# Patient Record
Sex: Male | Born: 1970 | Race: White | Hispanic: No | Smoking: Current every day smoker
Health system: Southern US, Community
[De-identification: ages and names within clinical notes are randomized; demographics above are authoritative.]

## PROBLEM LIST (undated history)

## (undated) DIAGNOSIS — I10 Essential (primary) hypertension: Secondary | ICD-10-CM

## (undated) HISTORY — PX: APPENDECTOMY: SHX54

## (undated) HISTORY — PX: KIDNEY SURGERY: SHX687

---

## 1999-05-10 ENCOUNTER — Emergency Department (HOSPITAL_COMMUNITY): Admission: EM | Admit: 1999-05-10 | Discharge: 1999-05-10 | Payer: Self-pay | Admitting: Emergency Medicine

## 1999-05-10 ENCOUNTER — Encounter: Payer: Self-pay | Admitting: Emergency Medicine

## 1999-09-08 ENCOUNTER — Emergency Department (HOSPITAL_COMMUNITY): Admission: EM | Admit: 1999-09-08 | Discharge: 1999-09-08 | Payer: Self-pay | Admitting: Emergency Medicine

## 2000-04-28 ENCOUNTER — Emergency Department (HOSPITAL_COMMUNITY): Admission: EM | Admit: 2000-04-28 | Discharge: 2000-04-28 | Payer: Self-pay | Admitting: Emergency Medicine

## 2000-04-28 ENCOUNTER — Encounter: Payer: Self-pay | Admitting: Emergency Medicine

## 2000-09-22 ENCOUNTER — Emergency Department (HOSPITAL_COMMUNITY): Admission: EM | Admit: 2000-09-22 | Discharge: 2000-09-22 | Payer: Self-pay | Admitting: Emergency Medicine

## 2000-12-15 ENCOUNTER — Emergency Department (HOSPITAL_COMMUNITY): Admission: EM | Admit: 2000-12-15 | Discharge: 2000-12-15 | Payer: Self-pay | Admitting: Emergency Medicine

## 2001-03-28 ENCOUNTER — Ambulatory Visit (HOSPITAL_COMMUNITY): Admission: RE | Admit: 2001-03-28 | Discharge: 2001-03-28 | Payer: Self-pay | Admitting: Family Medicine

## 2001-03-28 ENCOUNTER — Encounter: Payer: Self-pay | Admitting: Family Medicine

## 2001-11-30 ENCOUNTER — Emergency Department (HOSPITAL_COMMUNITY): Admission: EM | Admit: 2001-11-30 | Discharge: 2001-11-30 | Payer: Self-pay | Admitting: Emergency Medicine

## 2001-11-30 ENCOUNTER — Encounter: Payer: Self-pay | Admitting: Emergency Medicine

## 2001-12-08 ENCOUNTER — Encounter: Payer: Self-pay | Admitting: Orthopedic Surgery

## 2001-12-08 ENCOUNTER — Ambulatory Visit (HOSPITAL_COMMUNITY): Admission: RE | Admit: 2001-12-08 | Discharge: 2001-12-08 | Payer: Self-pay | Admitting: Orthopedic Surgery

## 2002-11-17 ENCOUNTER — Emergency Department (HOSPITAL_COMMUNITY): Admission: EM | Admit: 2002-11-17 | Discharge: 2002-11-17 | Payer: Self-pay | Admitting: Emergency Medicine

## 2002-11-17 ENCOUNTER — Encounter: Payer: Self-pay | Admitting: Emergency Medicine

## 2004-03-04 ENCOUNTER — Inpatient Hospital Stay (HOSPITAL_COMMUNITY): Admission: EM | Admit: 2004-03-04 | Discharge: 2004-03-05 | Payer: Self-pay | Admitting: Emergency Medicine

## 2004-10-05 ENCOUNTER — Emergency Department: Payer: Self-pay | Admitting: Emergency Medicine

## 2005-03-10 ENCOUNTER — Emergency Department (HOSPITAL_COMMUNITY): Admission: EM | Admit: 2005-03-10 | Discharge: 2005-03-10 | Payer: Self-pay | Admitting: Emergency Medicine

## 2005-06-24 ENCOUNTER — Emergency Department (HOSPITAL_COMMUNITY): Admission: EM | Admit: 2005-06-24 | Discharge: 2005-06-25 | Payer: Self-pay | Admitting: Emergency Medicine

## 2006-05-08 ENCOUNTER — Ambulatory Visit: Payer: Self-pay | Admitting: Infectious Diseases

## 2006-09-04 ENCOUNTER — Emergency Department (HOSPITAL_COMMUNITY): Admission: EM | Admit: 2006-09-04 | Discharge: 2006-09-04 | Payer: Self-pay | Admitting: Emergency Medicine

## 2007-06-27 ENCOUNTER — Emergency Department (HOSPITAL_COMMUNITY): Admission: EM | Admit: 2007-06-27 | Discharge: 2007-06-27 | Payer: Self-pay | Admitting: Emergency Medicine

## 2007-07-28 ENCOUNTER — Emergency Department (HOSPITAL_COMMUNITY): Admission: EM | Admit: 2007-07-28 | Discharge: 2007-07-28 | Payer: Self-pay | Admitting: Emergency Medicine

## 2008-01-26 ENCOUNTER — Emergency Department (HOSPITAL_COMMUNITY): Admission: EM | Admit: 2008-01-26 | Discharge: 2008-01-26 | Payer: Self-pay | Admitting: Emergency Medicine

## 2008-03-04 ENCOUNTER — Emergency Department (HOSPITAL_COMMUNITY): Admission: EM | Admit: 2008-03-04 | Discharge: 2008-03-04 | Payer: Self-pay | Admitting: Emergency Medicine

## 2008-06-07 ENCOUNTER — Emergency Department (HOSPITAL_COMMUNITY): Admission: EM | Admit: 2008-06-07 | Discharge: 2008-06-07 | Payer: Self-pay | Admitting: Emergency Medicine

## 2011-04-13 NOTE — H&P (Signed)
Alex Harris, Alex Harris                           ACCOUNT NO.:  [id number]   MEDICAL RECORD NO.:  [id number]                   PATIENT TYPE:  INP   LOCATION:  1843                                 FACILITY:  [facility]   PHYSICIAN:  Gabrielle Dare. Janee Morn, M.D.             DATE OF BIRTH:  Nov 22, 1971   DATE OF ADMISSION:  03/03/2004  DATE OF DISCHARGE:                                HISTORY & PHYSICAL   REASON FOR ADMISSION:  Left renal laceration and right knee sprain, status  post fall.   HISTORY OF PRESENT ILLNESS:  The patient is a 40 year old white male who was  walking on a gravel road when he slipped and fell in some mud.  He twisted  his right knee and fell.  He was a nontrauma code activation and came to the  emergency room complaining of some right knee pain, he also had some pain in  this left flank.  A CT scan was done which revealed a left renal hematoma  with some active extravasation.  X-ray of his right knee was negative for a  fracture.  He was initially evaluated by Dr. Lindie Spruce from the trauma service  and arranged for angiographic embolization for his kidney laceration and he  is going to be admitted to our service afterwards.   PAST MEDICAL HISTORY:  Negative.   PAST SURGICAL HISTORY:  Right knee surgery, suspected arthroscopic.   SOCIAL HISTORY:  He is married, does not drink or smoke.   MEDICATIONS:  None.   ALLERGIES:  No known drug allergies.   REVIEW OF SYSTEMS:  CONSTITUTIONAL: Negative.  EYES/EARS/NOSE AND THROAT:  Negative.  CARDIOVASCULAR: Negative.  GU: Negative.  GI: Negative.  PULMONARY: Negative.  SKIN/MUSCULOSKELETAL: See the history of present  illness.  NEURO/PSYCH: Negative.   PHYSICAL EXAMINATION:  VITAL SIGNS: Pulse 83, blood pressure 133/85,  respirations 18, temperature 98.1.  SKIN: Warm.  HEENT: Normocephalic, atraumatic.  Eye exam: Extraocular muscles are intact,  pupils are 2 mm and equal and reactive bilaterally.  Ears: Clear externally.  NECK:  No tenderness with no distended neck veins.  Trachea is in the  midline.  CHEST: No crepitance.  LUNGS: Clear to auscultation bilaterally.  HEART: Regular rate and rhythm.  PMI is palpable in the left chest.  ABDOMEN: Soft and nontender with some hypoactive bowel sounds.  BACK: Nontender along the midline.  He does have some left flank tenderness  to palpation.  PELVIS: Stable anteriorly.  EXTREMITIES: He had some swelling around his right knee with tenderness.  There was a Band-Aid on his right groin from his angio.  NEURO: Alert and oriented.  Glasgow coma scale 15.  Cranial nerves II-XII  were grossly intact.  Sensation and motor exam is intact bilaterally.  Upper  and lower extremity vascular exam was intact.   LABORATORY STUDIES:  Hemoglobin was 14.2, platelets 167, INR 1, PT 13.4,  sodium 138, potassium 4.1,  chloride 107, BUN 14, glucose 129.  X-rays, leg  film of his right knee showed nothing acute.  A CT scan of the abdomen and  pelvis shows a renal laceration on the left with active extravasation and a  hematoma.   IMPRESSION/PLAN:  34. A 40 year old white male who is status post fall and right knee sprain.  2. Left renal laceration with extravasation.   PLAN:  1. The patient underwent left renal angiogram with embolization.  2. Hemostasis, we plan to admit him for pain control.  I will also get a     followup CBC later this afternoon and keep a close eye on his hemoglobin     level.   Plan was discussed in detail with the patient and his wife and questions  were answered.                                                Gabrielle Dare Janee Morn, M.D.    BET/MEDQ  D:  03/04/2004  T:  03/05/2004  Job:  [id number]

## 2011-04-13 NOTE — Discharge Summary (Signed)
Alex Harris, Alex Harris                           ACCOUNT NO.:  [id number]   MEDICAL RECORD NO.:  [id number]                   PATIENT TYPE:  INP   LOCATION:  5711                                 FACILITY:  [facility]   PHYSICIAN:  Jimmye Norman, M.D.                   DATE OF BIRTH:  Jul 28, 1971   DATE OF ADMISSION:  03/03/2004  DATE OF DISCHARGE:  03/05/2004                                 DISCHARGE SUMMARY   FINAL DIAGNOSES:  1. Fall.  2. Right knee sprain.  3. Left renal laceration.   PROCEDURE:  Embolization of left renal bleeding artery performed by  radiology.   HISTORY:  This is a 40 year old white male who was walking down a graveled  road when he slipped in some mud, straining his right knee. He came to the  emergency room via POV. Workup revealed a left renal hematoma with active  extravasation. Because of this, he was taken over to radiology per special  procedures and had an embolization of this bleeding part of the artery. The  patient tolerated this procedure well.   HOSPITAL COURSE:  The patient was subsequently admitted to the hospital.  Repeat blood work revealed a stable hemoglobin and hematocrit _____________  stable. Initial hemoglobin was 14.2 with a hematocrit of 40.5. On March 04, 2004, it drifted down to 13.8 and 39.1 hematocrit, then on March 05, 2004  was 13.6 and 38.4 hematocrit. The patient was doing well at this time,  having no complaints, up and ambulating without difficulty. At this point,  he was ready to go home. The patient was given Percocet one to two p.o. q.4-  6h. p.r.n. for pain. He needs to follow up with the trauma service on April  12. He is subsequently discharged at this time in satisfactory and stable  condition.      [name], P.A.                      Jimmye Norman, M.D.    CL/MEDQ  D:  03/05/2004  T:  03/06/2004  Job:  [id number]

## 2011-08-20 LAB — I-STAT 8, (EC8 V) (CONVERTED LAB)
Acid-base deficit: 1
Hemoglobin: 15.6
Potassium: 3.7
Sodium: 140
TCO2: 25

## 2011-08-20 LAB — DIFFERENTIAL
Eosinophils Relative: 1
Lymphocytes Relative: 21
Lymphs Abs: 2.1
Neutrophils Relative %: 71

## 2011-08-20 LAB — CBC
Hemoglobin: 15.6
Platelets: 217

## 2011-08-20 LAB — POCT I-STAT CREATININE
Creatinine, Ser: 1
Operator id: 257131

## 2011-08-21 LAB — DIFFERENTIAL
Basophils Absolute: 0.1
Basophils Relative: 1
Eosinophils Absolute: 0.1
Eosinophils Relative: 1
Lymphocytes Relative: 18
Lymphs Abs: 2.2
Monocytes Absolute: 0.6
Monocytes Relative: 5
Neutro Abs: 9.2 — ABNORMAL HIGH
Neutrophils Relative %: 75

## 2011-08-21 LAB — CBC
HCT: 42.5
Hemoglobin: 15.1
MCHC: 35.5
MCV: 92.9
Platelets: 161
RBC: 4.57
RDW: 13.6
WBC: 12.1 — ABNORMAL HIGH

## 2011-08-21 LAB — POCT I-STAT, CHEM 8
BUN: 14
Calcium, Ion: 1.17
Chloride: 108
Creatinine, Ser: 1
Glucose, Bld: 98
HCT: 43
Hemoglobin: 14.6
Potassium: 4
Sodium: 140
TCO2: 24

## 2011-09-10 LAB — BASIC METABOLIC PANEL
CO2: 26
Calcium: 8.8
GFR calc Af Amer: >60
Sodium: 140

## 2012-11-05 ENCOUNTER — Emergency Department: Payer: Self-pay | Admitting: Internal Medicine

## 2012-12-08 ENCOUNTER — Emergency Department: Payer: Self-pay | Admitting: Emergency Medicine

## 2012-12-08 LAB — URINALYSIS, COMPLETE
Glucose,UR: NEGATIVE mg/dL (ref 0–75)
Nitrite: NEGATIVE
Protein: NEGATIVE
WBC UR: 4 /HPF (ref 0–5)

## 2012-12-08 LAB — COMPREHENSIVE METABOLIC PANEL
Albumin: 3.6 g/dL (ref 3.4–5.0)
Anion Gap: 7 (ref 7–16)
BUN: 9 mg/dL (ref 7–18)
Bilirubin,Total: 0.8 mg/dL (ref 0.2–1.0)
Chloride: 106 mmol/L (ref 98–107)
Co2: 26 mmol/L (ref 21–32)
Osmolality: 276 (ref 275–301)
Total Protein: 7.9 g/dL (ref 6.4–8.2)

## 2012-12-08 LAB — LIPASE, BLOOD: Lipase: 206 U/L (ref 73–393)

## 2012-12-08 LAB — CBC
HCT: 45.1 % (ref 40.0–52.0)
MCHC: 34.7 g/dL (ref 32.0–36.0)
MCV: 93 fL (ref 80–100)
Platelet: 162 10*3/uL (ref 150–440)

## 2012-12-08 LAB — PROTIME-INR: INR: 1

## 2013-02-10 ENCOUNTER — Emergency Department (HOSPITAL_COMMUNITY)
Admission: EM | Admit: 2013-02-10 | Discharge: 2013-02-11 | Discharge status: Against Medical Advice | Payer: Self-pay | Attending: Emergency Medicine | Admitting: Emergency Medicine

## 2013-02-10 ENCOUNTER — Encounter (HOSPITAL_COMMUNITY): Payer: Self-pay | Admitting: *Deleted

## 2013-02-10 DIAGNOSIS — F1210 Cannabis abuse, uncomplicated: Secondary | ICD-10-CM | POA: Insufficient documentation

## 2013-02-10 DIAGNOSIS — Z5900 Homelessness unspecified: Secondary | ICD-10-CM | POA: Insufficient documentation

## 2013-02-10 DIAGNOSIS — F419 Anxiety disorder, unspecified: Secondary | ICD-10-CM

## 2013-02-10 DIAGNOSIS — F329 Major depressive disorder, single episode, unspecified: Secondary | ICD-10-CM

## 2013-02-10 DIAGNOSIS — I10 Essential (primary) hypertension: Secondary | ICD-10-CM | POA: Insufficient documentation

## 2013-02-10 DIAGNOSIS — F411 Generalized anxiety disorder: Secondary | ICD-10-CM | POA: Insufficient documentation

## 2013-02-10 DIAGNOSIS — F3289 Other specified depressive episodes: Secondary | ICD-10-CM | POA: Insufficient documentation

## 2013-02-10 DIAGNOSIS — F17200 Nicotine dependence, unspecified, uncomplicated: Secondary | ICD-10-CM | POA: Insufficient documentation

## 2013-02-10 HISTORY — DX: Essential (primary) hypertension: I10

## 2013-02-10 MED ORDER — ALUM & MAG HYDROXIDE-SIMETH 200-200-20 MG/5ML PO SUSP: 30.0000 mL | ORAL | Status: DC | PRN

## 2013-02-10 MED ORDER — ZOLPIDEM TARTRATE 5 MG PO TABS: 5.0000 mg | ORAL_TABLET | Freq: Every evening | ORAL | Status: DC | PRN

## 2013-02-10 MED ORDER — IBUPROFEN 200 MG PO TABS: 600.0000 mg | ORAL_TABLET | Freq: Three times a day (TID) | ORAL | Status: DC | PRN

## 2013-02-10 MED ORDER — ACETAMINOPHEN 325 MG PO TABS: 650.0000 mg | ORAL_TABLET | ORAL | Status: DC | PRN

## 2013-02-10 MED ORDER — ONDANSETRON HCL 8 MG PO TABS: 4.0000 mg | ORAL_TABLET | Freq: Three times a day (TID) | ORAL | Status: DC | PRN

## 2013-02-10 MED ORDER — LORAZEPAM 1 MG PO TABS: 1.0000 mg | ORAL_TABLET | Freq: Once | ORAL | Status: DC

## 2013-02-10 MED ORDER — NICOTINE 21 MG/24HR TD PT24: 21.0000 mg | MEDICATED_PATCH | Freq: Every day | TRANSDERMAL | Status: DC

## 2013-02-10 NOTE — ED Notes (Signed)
Charge nurse at bedside talking with pt

## 2013-02-10 NOTE — ED Notes (Signed)
Pt is wanting to go outside and smoke a cigarette. When told he could not leave the facility to smoke pt became agitated and states he is going to leave. When offered a nicotine patch, pt refuses and states he is going to go smoke or he is leaving. Pt states that "I have been lied to since I've been here." When asked what pt means he says "that girl at registration said I could go out and smoke." Pt is resistant to further treatment until he can smoke a cigarette. Tiffany PA is aware of situation.

## 2013-02-10 NOTE — ED Notes (Signed)
ACT team still at bedside with pt.

## 2013-02-10 NOTE — ED Notes (Signed)
Pt is expressing hopelessness. Pt states he doesn't have a plan to hurt himself, but has had thoughts about it. Pt states "I am living in a barn on my land with no power/water/heat because me and my wife of 13 years separated because she is messing around with an 44 year old boy that I helped raise. I quit my job because that son of a bitch works with me and I didn't think it was healthy to see him every day. I don't have a car to get around in, and I am just overwhelmed and I don't know what to do anymore. I've been leaning on my 107 year old son from my first marriage, and hes the one who should be leaning on me. It ain't right. I feel lower than dirt right now." pt is tearful when nurse was assessing pt. Pt states he has had problems with alcohol in the past, and stopped drinking for a while, but today drank "a lot" of alcohol because he thought it would help him. Pt also states he has smoked marijuana recently because he was trying to get rid of his emotional pain, because he "doesn't want to feel anymore." When nurse asked if he had a plan to harm himself, pt states, "I don't want to slit my wrist or anything like that, I just would be okay if I just laid here on this bed and never woke up."

## 2013-02-10 NOTE — ED Notes (Signed)
PA Tiffany at bedside.  

## 2013-02-10 NOTE — ED Notes (Signed)
Pt very tearful in triage. Pt was brought by sheriff. Pt states that he feels like he is depressed. Pt states that he is overwhelmed. Pt has family issues, work issues and living issues. Pt states that he is too overwhelmed to say if he is SI or HI. Pt states that he is suppose to be stronger than this. Pt needs resources for his depression.

## 2013-02-10 NOTE — ED Notes (Signed)
Pt given warm blanket. Updated on wait.

## 2013-02-10 NOTE — ED Notes (Signed)
Pt states he has not eaten anything since 8am on march 17th. Pt given sandwich and ice for drink.

## 2013-02-10 NOTE — ED Provider Notes (Signed)
History    This chart was scribed for non-physician practitioner working with Juliet Rude. Rubin Payor, MD by Donne Anon, ED Scribe. This patient was seen in room [facility] and the patient's care was started at 2143.   CSN: [id number]  Arrival date & time 02/10/13  2005   First MD Initiated Contact with Patient 02/10/13 2143      Chief Complaint  Patient presents with  . Depression     The history is provided by the patient. No language interpreter was used.   [place] Fassnacht is a 42 y.o. male who presents to the Emergency Department complaining of anxiety which causes him associated trembling. He discontinued his blood pressure medication 5 years ago because he cannot afford it. He denies any pain or recent injury. He is a recovered alcoholic and denies a history of seizures from alcohol withdrawel.   He came to the ED today because he is currently homeless and his wife left him for his 57 year old son from his first marriage. He is hopeless, depressed, and living in a barn right now without power, heat or water. He drank a large amount of alcohol and smoke a large amount of marijuana today because he though it would help. He does not want to hurt himself but he states he would hurt the 44 year old if he got the chance.   Past Medical History  Diagnosis Date  . Hypertension     Past Surgical History  Procedure Laterality Date  . Appendectomy    . 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 No pertinent family history.  History  Substance Use Topics  . Smoking status: Current Every Day Smoker  . Smokeless tobacco: Not on file  . Alcohol Use: Yes      Review of Systems  Psychiatric/Behavioral: Negative for suicidal ideas. The patient is nervous/anxious.   All other systems reviewed and are negative.    Allergies  Review of patient's allergies indicates no known allergies.  Home Medications  No current outpatient prescriptions on  file.  BP 117/97  Pulse 99  Temp(Src) 98.4 F (36.9 C) (Oral)  Resp 16  SpO2 99%  Physical Exam  Nursing note and vitals reviewed. Constitutional: He is oriented to person, place, and time. He appears well-developed and well-nourished. No distress.  HENT:  Head: Normocephalic and atraumatic.  Eyes: EOM are normal.  Neck: Neck supple. No tracheal deviation present.  Cardiovascular: Normal rate.   Pulmonary/Chest: Effort normal. No respiratory distress.  Musculoskeletal: Normal range of motion.  Neurological: He is alert and oriented to person, place, and time.  Skin: Skin is warm and dry.  Psychiatric: His behavior is normal. His mood appears anxious. His affect is angry. He is not actively hallucinating. Thought content is not paranoid. He exhibits a depressed mood. He expresses homicidal ideation. He expresses no suicidal ideation. He expresses no suicidal plans and no homicidal plans.  Pt shaking because he is very anxious.    ED Course  Procedures (including critical care time) DIAGNOSTIC STUDIES: Oxygen Saturation is 99% on room air, normal by my interpretation.    COORDINATION OF CARE: 10:48 PM Discussed treatment plan which includes consult with ACT team with pt at bedside and pt agreed to plan.   10:49 PM Marcus from ACT team has gone in to see pt.    Labs Reviewed  CBC WITH DIFFERENTIAL  BASIC METABOLIC PANEL  ETHANOL  URINE RAPID  DRUG SCREEN (HOSP PERFORMED)   No results found.   1. Depression   2. Homelessness   3. Anxiety       MDM   Patient was seen by ACT and Berna Spare has recommended he stay inpatient but does not meet criteria for needing to be IVC'd. The patient after being seen wanted a cigarette and when told he can not have one but was more than welcome to have an Ativan or a Nicotine patch, he refused and became irritable and rude. HE said he wants his damn cigarettes or he is walking out. Patient refuses blood work and walked out after being  talked to by Press photographer. Berna Spare from ACT said patient has no clear plan.   Pt left AMA       Dorthula Matas, PA-C 02/10/13 2347

## 2013-02-10 NOTE — ED Notes (Signed)
Berna Spare from [facility] team will see pt shortly.

## 2013-02-10 NOTE — ED Notes (Addendum)
Spoke with pt. Pt very aggitated and not willing to listen to alternatives to smoking. Pt offered a nicotine patch. Pt states that he is going to get a ride and leave. Pt informed that if he left he would be leaving AMA. Pt aware and left. PA aware. Pt still denies SI and HI.

## 2013-02-10 NOTE — ED Notes (Signed)
Pt is still resisting treatment after speaking with charge nurse. Pt talked over charge nurse during conversation and would not listen to alternative options to smoking a cigarette. Pt left AMA.

## 2013-02-10 NOTE — ED Notes (Signed)
ACT team at bedside for consult.

## 2013-02-11 ENCOUNTER — Encounter (HOSPITAL_COMMUNITY): Payer: Self-pay

## 2013-02-11 ENCOUNTER — Inpatient Hospital Stay (HOSPITAL_COMMUNITY)
Admission: RE | Admit: 2013-02-11 | Discharge: 2013-02-13 | DRG: 885 | Disposition: A | Discharge status: Home / Self Care | Payer: Federal, State, Local not specified - Other | Source: Ambulatory Visit | Attending: Psychiatry | Admitting: Psychiatry

## 2013-02-11 ENCOUNTER — Emergency Department (HOSPITAL_COMMUNITY)
Admission: EM | Admit: 2013-02-11 | Discharge: 2013-02-11 | Disposition: A | Discharge status: Other Acute Inpatient Hospital | Payer: Self-pay | Attending: Emergency Medicine | Admitting: Emergency Medicine

## 2013-02-11 DIAGNOSIS — F411 Generalized anxiety disorder: Secondary | ICD-10-CM | POA: Insufficient documentation

## 2013-02-11 DIAGNOSIS — F3289 Other specified depressive episodes: Secondary | ICD-10-CM | POA: Insufficient documentation

## 2013-02-11 DIAGNOSIS — F1210 Cannabis abuse, uncomplicated: Secondary | ICD-10-CM | POA: Insufficient documentation

## 2013-02-11 DIAGNOSIS — I10 Essential (primary) hypertension: Secondary | ICD-10-CM | POA: Diagnosis present

## 2013-02-11 DIAGNOSIS — R454 Irritability and anger: Secondary | ICD-10-CM

## 2013-02-11 DIAGNOSIS — Z5900 Homelessness unspecified: Secondary | ICD-10-CM | POA: Insufficient documentation

## 2013-02-11 DIAGNOSIS — F329 Major depressive disorder, single episode, unspecified: Principal | ICD-10-CM | POA: Diagnosis present

## 2013-02-11 DIAGNOSIS — F17200 Nicotine dependence, unspecified, uncomplicated: Secondary | ICD-10-CM | POA: Insufficient documentation

## 2013-02-11 DIAGNOSIS — F911 Conduct disorder, childhood-onset type: Secondary | ICD-10-CM | POA: Insufficient documentation

## 2013-02-11 DIAGNOSIS — Z79899 Other long term (current) drug therapy: Secondary | ICD-10-CM

## 2013-02-11 DIAGNOSIS — Z634 Disappearance and death of family member: Secondary | ICD-10-CM

## 2013-02-11 DIAGNOSIS — F419 Anxiety disorder, unspecified: Secondary | ICD-10-CM

## 2013-02-11 LAB — BASIC METABOLIC PANEL
Calcium: 9.1 mg/dL (ref 8.4–10.5)
Creatinine, Ser: 0.99 mg/dL (ref 0.50–1.35)
GFR calc Af Amer: >90 mL/min (ref >90)
GFR calc non Af Amer: >90 mL/min (ref >90)
Sodium: 139 mEq/L (ref 135–145)

## 2013-02-11 LAB — CBC WITH DIFFERENTIAL/PLATELET
Basophils Absolute: 0 10*3/uL (ref 0.0–0.1)
Basophils Relative: 0 % (ref 0–1)
Eosinophils Relative: 1 % (ref 0–5)
HCT: 45.2 % (ref 39.0–52.0)
MCHC: 35.4 g/dL (ref 30.0–36.0)
MCV: 92.4 fL (ref 78.0–100.0)
Monocytes Absolute: 0.6 10*3/uL (ref 0.1–1.0)
Platelets: 174 10*3/uL (ref 150–400)
RDW: 13.1 % (ref 11.5–15.5)
WBC: 10.4 10*3/uL (ref 4.0–10.5)

## 2013-02-11 LAB — RAPID URINE DRUG SCREEN, HOSP PERFORMED: Opiates: NOT DETECTED

## 2013-02-11 LAB — ETHANOL: Alcohol, Ethyl (B): <11 mg/dL (ref 0–11)

## 2013-02-11 MED ORDER — LORAZEPAM 1 MG PO TABS
1.0000 mg | ORAL_TABLET | Freq: Once | ORAL | Status: AC
  Administered 2013-02-11: 1 mg via ORAL
  Filled 2013-02-11: qty 1

## 2013-02-11 MED ORDER — NICOTINE 21 MG/24HR TD PT24
21.0000 mg | MEDICATED_PATCH | Freq: Every day | TRANSDERMAL | Status: DC
  Administered 2013-02-11 (×2): 21 mg via TRANSDERMAL
  Filled 2013-02-11 (×3): qty 1

## 2013-02-11 MED ORDER — ACETAMINOPHEN 325 MG PO TABS: 650.0000 mg | ORAL_TABLET | ORAL | Status: DC | PRN

## 2013-02-11 MED ORDER — ONDANSETRON HCL 8 MG PO TABS
4.0000 mg | ORAL_TABLET | Freq: Three times a day (TID) | ORAL | Status: DC | PRN
  Filled 2013-02-11: qty 1

## 2013-02-11 MED ORDER — TRAZODONE HCL 50 MG PO TABS
50.0000 mg | ORAL_TABLET | Freq: Every evening | ORAL | Status: DC | PRN
  Administered 2013-02-11: 50 mg via ORAL
  Filled 2013-02-11: qty 1
  Filled 2013-02-11: qty 14

## 2013-02-11 MED ORDER — ALUM & MAG HYDROXIDE-SIMETH 200-200-20 MG/5ML PO SUSP
30.0000 mL | ORAL | Status: DC | PRN
  Filled 2013-02-11: qty 30

## 2013-02-11 MED ORDER — LORAZEPAM 2 MG/ML IJ SOLN
1.0000 mg | Freq: Once | INTRAMUSCULAR | Status: AC
  Administered 2013-02-11: 1 mg via INTRAMUSCULAR
  Filled 2013-02-11: qty 1

## 2013-02-11 MED ORDER — ZOLPIDEM TARTRATE 5 MG PO TABS
5.0000 mg | ORAL_TABLET | Freq: Every evening | ORAL | Status: DC | PRN
  Administered 2013-02-11: 5 mg via ORAL
  Filled 2013-02-11 (×2): qty 1

## 2013-02-11 MED ORDER — IBUPROFEN 400 MG PO TABS: 600.0000 mg | ORAL_TABLET | Freq: Three times a day (TID) | ORAL | Status: DC | PRN

## 2013-02-11 MED ORDER — ACETAMINOPHEN 325 MG PO TABS: 650.0000 mg | ORAL_TABLET | Freq: Four times a day (QID) | ORAL | Status: DC | PRN

## 2013-02-11 MED ORDER — NICOTINE 21 MG/24HR TD PT24
21.0000 mg | MEDICATED_PATCH | Freq: Every day | TRANSDERMAL | Status: DC
  Administered 2013-02-12 – 2013-02-13 (×2): 21 mg via TRANSDERMAL
  Filled 2013-02-11 (×3): qty 1

## 2013-02-11 MED ORDER — MAGNESIUM HYDROXIDE 400 MG/5ML PO SUSP: 30.0000 mL | Freq: Every day | ORAL | Status: DC | PRN

## 2013-02-11 MED ORDER — ALUM & MAG HYDROXIDE-SIMETH 200-200-20 MG/5ML PO SUSP: 30.0000 mL | ORAL | Status: DC | PRN

## 2013-02-11 MED ORDER — LORAZEPAM 2 MG/ML IJ SOLN: 2.0000 mg | Freq: Once | INTRAMUSCULAR | Status: DC

## 2013-02-11 NOTE — ED Notes (Signed)
Pt very anxious .Pt not willing to change into blue scrubs right now but will change after he feels less anxious.Hospital policy explained to the pt.

## 2013-02-11 NOTE — Tx Team (Signed)
Initial Interdisciplinary Treatment Plan  PATIENT STRENGTHS: (choose at least two) Ability for insight Communication skills General fund of knowledge Supportive family/friends  PATIENT STRESSORS: Financial difficulties Marital or family conflict Occupational concerns   PROBLEM LIST: Problem List/Patient Goals Date to be addressed Date deferred Reason deferred Estimated date of resolution  Suicide Ideations      Depression                                                 DISCHARGE CRITERIA:  Medical problems require only outpatient monitoring Need for constant or close observation no longer present Verbal commitment to aftercare and medication compliance  PRELIMINARY DISCHARGE PLAN: Attend aftercare/continuing care group Outpatient therapy  PATIENT/FAMIILY INVOLVEMENT: This treatment plan has been presented to and reviewed with the patient, Alex Harris, and/or family member, .  The patient and family have been given the opportunity to ask questions and make suggestions.  Alex Harris 02/11/2013, 2:53 PM

## 2013-02-11 NOTE — ED Notes (Signed)
Security paged to wand pt 

## 2013-02-11 NOTE — ED Notes (Signed)
Pt left AMA during my care, and then rechecked back in minutes later. Please see other charting for full assessment.

## 2013-02-11 NOTE — Progress Notes (Signed)
Patient pleasant and cooperative during admission assessment. Patient endorses passive SI, states "I don't want to take a handful of pills or anything but I would like to lay down, go to sleep and never wake up."  Patient contracts verbally for safety with RN at this time. Patient denies HI at this time. Patient denies AVH. Patient verbalizes "I am going through something in my life right now that I just can't get over." Patient states stressors are financial, states "I moved back here from [facility] in Jan 2014 because my work at [employer] ended, I still haven't been able to find a job." Patient living in a barn on Mother's property, patient's Mother passed away in 05/17/2013patient was not informed of Mother's death until two months later. Patient verbalizes relationship difficulties "I just found out in the last few weeks that my wife is having an affair with an 18yo boy that we helped raise since he was 42 years old." Patient states "the most important thing in the world to me is my family and I will do anything to get them back." Patient has 38 yo son and 13yo daughter who are currently with their his wife, wife will not disclose to patient where she and the children are living. Patient educated re fall risk prevention, patient fall risk assessed "low" at this time. Patient verbalizes understanding. Patient oriented to unit/staff/room. Patient denies any questions/concerns at this time. Patient safe on unit with Q15 minute checks for safety.

## 2013-02-11 NOTE — Progress Notes (Signed)
Adult Psychoeducational Group Note  Date:  02/11/2013 Time:  9:25 PM  Group Topic/Focus:  Making Healthy Choices:   The focus of this group is to help patients identify negative/unhealthy choices they were using prior to admission and identify positive/healthier coping strategies to replace them upon discharge.  Participation Level:  Active  Participation Quality:  Appropriate  Affect:  Appropriate  Cognitive:  Appropriate  Insight: Appropriate  Engagement in Group:  Supportive  Modes of Intervention:  Exploration  Additional Comments:    Adelina Mings 02/11/2013, 9:25 PM

## 2013-02-11 NOTE — ED Notes (Signed)
Phlebotomy informed to do lab draws for pt.

## 2013-02-11 NOTE — ED Provider Notes (Signed)
History     CSN: [id number]  Arrival date & time 02/11/13  0001   None     No chief complaint on file.   (Consider location/radiation/quality/duration/timing/severity/associated sxs/prior treatment) HPI Pt left AMA because he wanted a cigarette  But did not leave and said his wife talked him into coming back. Pt was gone for less than 3 minutes.   Alex Harris is a 42 y.o. male who presents to the Emergency Department complaining of anxiety which causes him associated trembling. He discontinued his blood pressure medication 5 years ago because he cannot afford it. He denies any pain or recent injury. He is a recovered alcoholic and denies a history of seizures from alcohol withdrawel.  He came to the ED today because he is currently homeless and his wife left him for his 5 year old son from his first marriage. He is hopeless, depressed, and living in a barn right now without power, heat or water. He drank a large amount of alcohol and smoke a large amount of marijuana today because he though it would help. He does not want to hurt himself but he states he would hurt the 43 year old if he got the chance.    Past Medical History  Diagnosis Date  . Hypertension     Past Surgical History  Procedure Laterality Date  . Appendectomy    . 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  History  Substance Use Topics  . Smoking status: Current Every Day Smoker  . Smokeless tobacco: Not on file  . Alcohol Use: Yes      Review of Systems Review of Systems  Psychiatric/Behavioral: Negative for suicidal ideas. The patient is nervous/anxious.  All other systems reviewed and are negative.    Allergies  Review of patient's allergies indicates no known allergies.  Home Medications  No current outpatient prescriptions on file.  There were no vitals taken for this visit.  Physical Exam Physical Exam  Nursing note and vitals reviewed.   Constitutional: He is oriented to person, place, and time. He appears well-developed and well-nourished. No distress.  HENT:  Head: Normocephalic and atraumatic.  Eyes: EOM are normal.  Neck: Neck supple. No tracheal deviation present.  Cardiovascular: Normal rate.  Pulmonary/Chest: Effort normal. No respiratory distress.  Musculoskeletal: Normal range of motion.  Neurological: He is alert and oriented to person, place, and time.  Skin: Skin is warm and dry.  Psychiatric: His behavior is normal. His mood appears anxious. His affect is angry. He is not actively hallucinating. Thought content is not paranoid. He exhibits a depressed mood. He expresses homicidal ideation. He expresses no suicidal ideation. He expresses no suicidal plans and no homicidal plans.  Pt shaking because he is very anxious.   ED Course  Procedures (including critical care time)  Labs Reviewed  CBC WITH DIFFERENTIAL  BASIC METABOLIC PANEL  ETHANOL  URINE RAPID DRUG SCREEN (HOSP PERFORMED)   No results found.   1. Anxiety   2. Depression   3. Homelessness   4. Outbursts of anger       MDM  Blood work drawn, holding orders placed. No home meds.  Berna Spare is talking to behavioral health to se eif he can get him a bed.        Dorthula Matas, PA-C 02/11/13 0005  Dorthula Matas, PA-C 02/11/13 0009
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
## 2013-02-11 NOTE — BH Assessment (Signed)
BHH Assessment Progress Note      Consulted with Serena Colonel NP re admitting this patient to the [facility]. She accepted him to Dr. Merlene Morse service on the 500 hall to bed 506-1

## 2013-02-11 NOTE — ED Provider Notes (Signed)
Medical screening examination/treatment/procedure(s) were performed by non-physician practitioner and as supervising physician I was immediately available for consultation/collaboration.  Juluis Fitzsimmons R. Aniah Pauli, MD 02/11/13 0001 

## 2013-02-11 NOTE — ED Provider Notes (Signed)
Medical screening examination/treatment/procedure(s) were performed by non-physician practitioner and as supervising physician I was immediately available for consultation/collaboration.  Olivia Mackie, MD 02/11/13 [phone]

## 2013-02-12 DIAGNOSIS — F329 Major depressive disorder, single episode, unspecified: Principal | ICD-10-CM

## 2013-02-12 DIAGNOSIS — Z634 Disappearance and death of family member: Secondary | ICD-10-CM

## 2013-02-12 MED ORDER — ZOLPIDEM TARTRATE 5 MG PO TABS
5.0000 mg | ORAL_TABLET | Freq: Every day | ORAL | Status: DC
  Administered 2013-02-12: 5 mg via ORAL
  Filled 2013-02-12 (×2): qty 1

## 2013-02-12 MED ORDER — SERTRALINE HCL 25 MG PO TABS
25.0000 mg | ORAL_TABLET | Freq: Every day | ORAL | Status: DC
  Administered 2013-02-12 – 2013-02-13 (×2): 25 mg via ORAL
  Filled 2013-02-12 (×3): qty 1

## 2013-02-12 NOTE — Progress Notes (Signed)
[facility] LCSW Group Therapy  [employer] [facility] [place] 1:15 - 2:30  02/12/2013 3:11 PM  Type of Therapy:  Group Therapy  Participation Level:  Active  Participation Quality:  Appropriate and Attentive  Affect:  Appropriate  Cognitive:  Alert and Appropriate  Insight:  Engaged  Engagement in Therapy:  Engaged  Modes of Intervention:  Discussion, Education, Problem-solving, Rapport Building and Support  Summary of Progress/Problems:  Patient listened attentively to speaker from [employer].  Patient asked questions about how patient overcame illness and services offered by [facility].  Wynn Banker 02/12/2013, 3:11 PM

## 2013-02-12 NOTE — H&P (Signed)
Psychiatric Admission Assessment Adult  Patient Identification:  Alex Harris Date of Evaluation:  02/12/2013 Chief Complaint:  MDD History of Present Illness: On admission Alex Harris came to [facility] because he has been increasingly depressed. He says, "I'm emotionally drained and mentally zapped." Patient found out recently that his wife has been cheating on him with an 44 year old boy that they both helped raise. 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 Patient is currently living in a barn on land that he owns. He lives there by himself with no heat or running water. Patient says that he has not eaten well in weeks and has lost weight because of it. He sleeps less than 4 hours per day and feels nervous all the time. Patient also reports recent memory loss or inability to recall things and "zoning out" during conversations.        Today upon assessment by provider the was initially feeling very agitated and angry. Patient wanted medicine to help him sleep and calm down. He stated "Over in the hospital I had [place] and ativan." Patient talked about his anger toward his sister who did no inform him of his mother's death until two months after the fact. He stated "I have had a very rough few years." Patient reports financial stressors, marital problems, and family conflict as stressors. Patient is very upset about having no closure with his mother and also being unemployed. He has had no insurance to pursue mental health care. Patient is very interested in being started on an affordable medicine and finding a good therapist. The patient was much calmer at the end of the interview after his treatment plan was reviewed with him. Patient was then able to begin to participate in his hospital treatment.    Associated Signs/Synptoms: Depression Symptoms:  depressed mood, feelings of worthlessness/guilt, anxiety, loss of  energy/fatigue, disturbed sleep, weight loss, (Hypo) Manic Symptoms:  Irritable Mood, Anxiety Symptoms:  Excessive Worry, Psychotic Symptoms:  Denies PTSD Symptoms: Denies  Psychiatric Specialty Exam: Physical Exam  Constitutional: He appears well-developed and well-nourished.  Completed in the ED and results reviewed.  ROS  Blood pressure 149/85, pulse 85, temperature 98.2 F (36.8 C), temperature source Oral, resp. rate 18, height 5' 9.29" (1.76 m), weight 121.564 kg (268 lb), SpO2 98.00%.Body mass index is 39.24 kg/(m^2).  General Appearance: Casual  Eye Contact::  Good  Speech:  Clear and Coherent  Volume:  Increased  Mood:  Angry and Anxious  Affect:  Labile  Thought Process:  Circumstantial  Orientation:  Full (Time, Place, and Person)  Thought Content:  Rumination  Suicidal Thoughts:  No  Homicidal Thoughts:  No  Memory:  Immediate;   Good Recent;   Good Remote;   Good  Judgement:  Fair  Insight:  Fair  Psychomotor Activity:  Normal  Concentration:  Good  Recall:  Good  Akathisia:  No  Handed:  Right  AIMS (if indicated):     Assets:  Communication Skills Desire for Improvement Intimacy Leisure Time Physical Health Resilience Social Support Vocational/Educational  Sleep:  Number of Hours: 5.75    Past Psychiatric History:None Diagnosis:None in the past  Hospitalizations:None reported  Outpatient Care:None  Substance Abuse Care: None  Self-Mutilation:Patient denies  Suicidal Attempts: Patient denies  Violent Behaviors: None    Past Medical History:   Past Medical History  Diagnosis Date  . Hypertension  None. Allergies:  No Known Allergies PTA Medications: No prescriptions prior to admission    Previous Psychotropic Medications:  Medication/Dose-None reported                 Substance Abuse History in the last 12 months:  yes  Consequences of Substance Abuse: Reports that he stopped drinking one month ago. History of DUI seven  years ago.  Social History:  reports that he has been smoking Cigarettes.  He has a 87 pack-year smoking history. He has never used smokeless tobacco. He reports that he uses illicit drugs (Marijuana). He reports that he does not drink alcohol. Additional Social History:                      Current Place of Residence: [facility] of Birth: [place], [place] Family Members: Marital Status:  Married Children: 1  Sons: 1  Daughters: Relationships: Education:  Dropped out in 11 th grade Educational Problems/Performance: Religious Beliefs/Practices: Christian History of Abuse (Emotional/Phsycial/Sexual) Denies Occupational Experiences;-Owned auto reposession Merchandiser, retail History:  None. Legal History: None Hobbies/Interests: Working on cars  Family History:  History reviewed. No pertinent family history.  Results for orders placed during the hospital encounter of 02/11/13 (from the past 72 hour(s))  CBC WITH DIFFERENTIAL     Status: None   Collection Time    02/11/13 12:41 AM      Result Value Range   WBC 10.4  4.0 - 10.5 K/uL   RBC 4.89  4.22 - 5.81 MIL/uL   Hemoglobin 16.0  13.0 - 17.0 g/dL   HCT 45.4  09.8 - 11.9 %   MCV 92.4  78.0 - 100.0 fL   MCH 32.7  26.0 - 34.0 pg   MCHC 35.4  30.0 - 36.0 g/dL   RDW 14.7  82.9 - 56.2 %   Platelets 174  150 - 400 K/uL   Neutrophils Relative 65  43 - 77 %   Neutro Abs 6.8  1.7 - 7.7 K/uL   Lymphocytes Relative 28  12 - 46 %   Lymphs Abs 2.9  0.7 - 4.0 K/uL   Monocytes Relative 6  3 - 12 %   Monocytes Absolute 0.6  0.1 - 1.0 K/uL   Eosinophils Relative 1  0 - 5 %   Eosinophils Absolute 0.1  0.0 - 0.7 K/uL   Basophils Relative 0  0 - 1 %   Basophils Absolute 0.0  0.0 - 0.1 K/uL  BASIC METABOLIC PANEL     Status: Abnormal   Collection Time    02/11/13 12:41 AM      Result Value Range   Sodium 139  135 - 145 mEq/L   Potassium 4.1  3.5 - 5.1 mEq/L   Chloride 102  96 - 112 mEq/L   CO2 26  19 - 32 mEq/L    Glucose, Bld 102 (*) 70 - 99 mg/dL   BUN 9  6 - 23 mg/dL   Creatinine, Ser 1.30  0.50 - 1.35 mg/dL   Calcium 9.1  8.4 - 86.5 mg/dL   GFR calc non Af Amer >90  >90 mL/min   GFR calc Af Amer >90  >90 mL/min   Comment:            The eGFR has been calculated     using the CKD EPI equation.     This calculation has not been     validated in all clinical  situations.     eGFR's persistently     <90 mL/min signify     possible Chronic Kidney Disease.  ETHANOL     Status: None   Collection Time    02/11/13 12:41 AM      Result Value Range   Alcohol, Ethyl (B) <11  0 - 11 mg/dL   Comment:            LOWEST DETECTABLE LIMIT FOR     SERUM ALCOHOL IS 11 mg/dL     FOR MEDICAL PURPOSES ONLY  URINE RAPID DRUG SCREEN (HOSP PERFORMED)     Status: Abnormal   Collection Time    02/11/13  8:20 AM      Result Value Range   Opiates NONE DETECTED  NONE DETECTED   Cocaine NONE DETECTED  NONE DETECTED   Benzodiazepines NONE DETECTED  NONE DETECTED   Amphetamines NONE DETECTED  NONE DETECTED   Tetrahydrocannabinol POSITIVE (*) NONE DETECTED   Barbiturates NONE DETECTED  NONE DETECTED   Comment:            DRUG SCREEN FOR MEDICAL PURPOSES     ONLY.  IF CONFIRMATION IS NEEDED     FOR ANY PURPOSE, NOTIFY LAB     WITHIN 5 DAYS.                LOWEST DETECTABLE LIMITS     FOR URINE DRUG SCREEN     Drug Class       Cutoff (ng/mL)     Amphetamine      1000     Barbiturate      200     Benzodiazepine   200     Tricyclics       300     Opiates          300     Cocaine          300     THC              50   Psychological Evaluations:  Assessment:   AXIS I:  Major Depression, single episode AXIS II:  Deferred AXIS III:   Past Medical History  Diagnosis Date  . Hypertension    AXIS IV:  housing problems, occupational problems, other psychosocial or environmental problems and problems with primary support group AXIS V:  41-50 serious symptoms  Treatment Plan/Recommendations:   1.  Admit for crisis management and stabilization. Estimated length of stay 5-7 days. 2. Medication management to reduce current symptoms to base line and improve the patient's level of functioning. Started on Zoloft 25 mg po daily for depressive and anxious symptoms. Ambien initiated to help improve sleep. 3. Develop treatment plan to decrease risk of relapse upon discharge of depressive symptoms and the need for readmission. 5. Group therapy to facilitate development of healthy coping skills to use for depression and anxiety. 6. Health care follow up as needed for medical problems.  7. Discharge plan to include therapy to help patient cope with death of mother and other stressors.  8. Call for Consult with Hospitalist for additional specialty patient services as needed.   Treatment Plan Summary: Daily contact with patient to assess and evaluate symptoms and progress in treatment Medication management Current Medications:  Current Facility-Administered Medications  Medication Dose Route Frequency Provider Last Rate Last Dose  . acetaminophen (TYLENOL) tablet 650 mg  650 mg Oral Q6H PRN Karolee Stamps, NP      . alum &  mag hydroxide-simeth (MAALOX/MYLANTA) 200-200-20 MG/5ML suspension 30 mL  30 mL Oral Q4H PRN Karolee Stamps, NP      . magnesium hydroxide (MILK OF MAGNESIA) suspension 30 mL  30 mL Oral Daily PRN Karolee Stamps, NP      . nicotine (NICODERM CQ - dosed in mg/24 hours) patch 21 mg  21 mg Transdermal Q0600 Karolee Stamps, NP   21 mg at 02/12/13 1610  . traZODone (DESYREL) tablet 50 mg  50 mg Oral QHS PRN Karolee Stamps, NP   50 mg at 02/11/13 2240    Observation Level/Precautions:  15 minute checks  Laboratory:  CBC Chemistry Profile Urine tox screen Reviewed and stable  Psychotherapy: None in recent past  Medications:  None  Consultations: None  Discharge Concerns:  None  Estimated LOS: 5-7 days  Other:     I certify that inpatient services furnished can reasonably be  expected to improve the patient's condition.   Fransisca Kaufmann ANN NP-C 3/20/20149:16 AM

## 2013-02-12 NOTE — Progress Notes (Signed)
Adult Psychoeducational Group Note  Date:  02/12/2013 Time:  7:18 PM  Group Topic/Focus:  Overcoming Stress:   The focus of this group is to define stress and help patients assess their triggers.  Participation Level:  Active  Participation Quality:  Appropriate, Attentive and Sharing  Affect:  Appropriate  Cognitive:  Appropriate  Insight: Appropriate  Engagement in Group:  Engaged  Modes of Intervention:  Discussion, Education, Socialization and Support  Additional Comments:  Leodis attended group and shared at times. Patient defined stress in own terms. Afterwards, patient explained what ways of good and bad stress. Patient then, partnered up with a peer and completed the stress interview in workbooks and afterwards shared what partner expressed. Managing stress ideas in workbook was reviewed with patient to complete during self-reflection time.   Karleen Hampshire Brittini 02/12/2013, 7:18 PM

## 2013-02-12 NOTE — BHH Counselor (Signed)
Adult Comprehensive Assessment  Patient ID: Alex Harris, male   DOB: Sep 23, 1971, 42 y.o.   MRN: [id number]  Information Source: Information source: Patient  Current Stressors:  Educational / Learning stressors: None Employment / Job issues: Patient recently lost job Family Relationships: Separation from wife two months ago Surveyor, quantity / Lack of resources (include bankruptcy): Difficulty due to being unemployed Housing / Lack of housing: Patient is currently homeless Physical health (include injuries & life threatening diseases): Hypertension Social relationships: None Substance abuse: Reports he has not been drinking since  December 2013  Living/Environment/Situation:  Living Arrangements: Alone Living conditions (as described by patient or guardian): Homeless How long has patient lived in current situation?: One month What is atmosphere in current home: Temporary  Family History:  Marital status: Separated Separated, when?: Two months What types of issues is patient dealing with in the relationship?: He and wife are trying to work through their problems Does patient have children?: Yes How many children?: 1 How is patient's relationship with their children?: Great  Childhood History:  By whom was/is the patient raised?: Both parents Additional childhood history information: Very good childhood Description of patient's relationship with caregiver when they were a child: Excellent Patient's description of current relationship with people who raised him/her: Parents are deceased Does patient have siblings?: Yes Number of Siblings: 1 Description of patient's current relationship with siblings: He and sister are having difficulty due to distribution of  mother's property Did patient suffer any verbal/emotional/physical/sexual abuse as a child?: No Did patient suffer from severe childhood neglect?: No Has patient ever been sexually abused/assaulted/raped as an adolescent or adult?:  No Was the patient ever a victim of a crime or a disaster?: No Witnessed domestic violence?: No Has patient been effected by domestic violence as an adult?: No  Education:  Highest grade of school patient has completed: 10th Currently a student?: No Learning disability?: No  Employment/Work Situation:   Employment situation: Unemployed Patient's job has been impacted by current illness: No What is the longest time patient has a held a job?: 21 years 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  As noted below he reports using THC 2 days ago.  Patient advised he was drinking up to 18 beers per day before Decemeber Alcohol/Substance Abuse Treatment Hx: Denies past history Has alcohol/substance abuse ever caused legal problems?: Yes (DUI 7 years ago)  Social Support System:   [employer] Support System: None Type of faith/religion: Environmental education officer How does patient's faith help to cope with current illness?: Chief Operating Officer:   Leisure and Hobbies: Therapist, music - working on automobiles  Strengths/Needs:   What things does the patient do well?: Automobile Restoration In what areas does patient struggle / problems for patient: Financing - Anger toward sister  Discharge Plan:   Does patient have access to transportation?: Yes Will patient be returning to same living situation after discharge?: Yes Currently receiving community mental health services: No If no, would patient like referral for services when discharged?:  [employer] [facility]) Does patient have financial barriers related to discharge medications?: Yes Patient description of barriers related to discharge medications: Patient  has no insurance or source of income.  Summary/Recommendations:  Patient is a 42 year old Caucasian  male admitted with Major Depression Disorder.  He will benefit from crisis stabilization, evaluation for medication, psycho-education groups for coping skills development, group therapy and case management for discharge planning.     Alex Harris, Joesph July. 02/12/2013

## 2013-02-12 NOTE — [facility] Suicide Risk Assessment (Signed)
Suicide Risk Assessment  Admission Assessment     Nursing information obtained from:  Patient Demographic factors:  Male;Low socioeconomic status;Living alone;Unemployed Current Mental Status:  Suicidal ideation indicated by patient Loss Factors:  Decrease in vocational status;Loss of significant relationship;Financial problems / change in socioeconomic status Historical Factors:  Family history of mental illness or substance abuse Risk Reduction Factors:  Responsible for children under 3 years of age;Sense of responsibility to family;Religious beliefs about death;Positive social support  CLINICAL FACTORS:   Depression:   Anhedonia Impulsivity Insomnia Severe  COGNITIVE FEATURES THAT CONTRIBUTE TO RISK:  Thought constriction (tunnel vision)    SUICIDE RISK:   Minimal: No identifiable suicidal ideation.  Patients presenting with no risk factors but with morbid ruminations; may be classified as minimal risk based on the severity of the depressive symptoms  PLAN OF CARE: Start medications if necessary. Educate patient about illness. Encourage attending groups and participating.  I certify that inpatient services furnished can reasonably be expected to improve the patient's condition.  Alex Harris 02/12/2013, 1:44 PM

## 2013-02-12 NOTE — Progress Notes (Signed)
  D) Patient pleasant and cooperative upon my assessment. Patient was irritable at the beginning of the shift, patient  concerned about sleeping medications. Patient encouraged to speak with MD re medication changes, patient verbalized understanding. Patient completed Patient Self Inventory, reports  appetite is "improving." Patient rates depression as   2/10, patient rates hopeless feelings as  2/10. Patient denies SI/HI, denies A/V hallucinations.   A) Patient offered support and encouragement, patient encouraged to discuss feelings/concerns with staff. Patient verbalized understanding. Patient monitored Q15 minutes for safety. Patient met with MD  to discuss today's goals and plan of care. Patient educated re 72 hour notice, patient signed 72 hour notice. MD notified, patient denies any further questions/concerns at this time.   R) Patient visible in milieu, attending groups in day room and meals in dining room. Patient appropriate with staff and peers.   Patient taking medications as ordered. Will continue to monitor.

## 2013-02-12 NOTE — Progress Notes (Signed)
Pt attended Karaoke group; participated well and was supportive of peers.

## 2013-02-12 NOTE — Progress Notes (Addendum)
Patient ID: Alex Harris, male   DOB: 1971/01/29, 42 y.o.   MRN: [id number] D: Pt is awake and active on the unit this PM. Pt denies SI/HI and A/V hallucinations. Pt rates their depression at 2 and hopelessness at 2. Pt's mood is euphoric and his affect is bright. Pt is intrusive and somewhat arrogant with staff. He states that [facility] is not the kind of help he needs, but is looking forward to his follow up plan with [facility]. Pt is pleased to have new sleeping medications, and states that he is in a much better mood this afternoon. This evening the pt became agitated due to an issue with a book his wife brought to him. Writer allowed pt to have the book, but he and his wife continued to escalate. They were extremely negative and accused staff, myself and the [facility], of lying to them about their care and discharge plan, and criticized every aspect of the milieu. The pt claims that his MD stated they would be discharging him tomorrow, so writer encouraged him to focus on the discharge plan. He will follow up with [facility] and establish care through them. The family continued to use abusive language and tell us how "worthless" we are. The wife left at 1930 hrs without further incident.  A: Encouraged pt to discuss feelings with staff and administered medication per MD orders. Writer also encouraged pt to attend groups.  R: Pt is attending groups and tolerating medications well. Writer will continue to monitor. 15 minute checks are ongoing for safety.

## 2013-02-12 NOTE — Progress Notes (Signed)
[facility] LCSW Aftercare Discharge Planning Group Note  02/12/2013 11:23 AM  Participation Quality:  Appropriate  Affect:  Defensive and Irritable  Cognitive:  Appropriate  Insight:  Resistant  Engagement in Group:  Resistant  Modes of Intervention:  Education, Exploration, Orientation, Rapport Building and Support  Summary of Progress/Problems:  Patient advised of admitting to hospital due to relationship problems with wife, unresolved issues around mother's death in 05-31-13loss of job and other family problems. He is currently denying SI/HI and wants to discharge from the hospital.  He shared he does not believe this place can help him and he wants to live and follow up with outpatient services.  He rates depression at three and anxiety at five.  Patient advised he was not being seen for outpatient.  He will stay with friends or return to previous living arrangement.  Daily workbook provided.   Wynn Banker 02/12/2013, 11:23 AM

## 2013-02-12 NOTE — Progress Notes (Signed)
Patient ID: Alex Harris, male   DOB: 1971-03-28, 42 y.o.   MRN: [id number] D: pt. Reports "I'm just wasting my time here, nobody has taken the time to ask me what's going on" Pt. Began to explain that he was having family issues. "I'm usually the one everybody else come to but here lately it's been harsh" Pt. Reports mom died last year and he found out about it on face book" Pt. Reports sister that had POA over mom didn't call and tell him. He reports sis borrowed money on family mobile home and lost it. Pt. Says land is all he has and has been living in a barn. He reports wife is having an affair with a guy they helped to raise. Pt. Is angry and cursing while telling this story. "then I come here and nobody f----king cares, they more worried about if I have insurance" A: Writer introduced self to client and provided emotional support by listening. Staff will monitor q17min for safety. R: Pt. Is safe on the unit. Pt. Did not attend group, vented with Clinical research associate. Pt. Later shaved shower and was seen on telephone.

## 2013-02-13 MED ORDER — SERTRALINE HCL 25 MG PO TABS: 25.0000 mg | ORAL_TABLET | Freq: Every day | ORAL | Status: AC

## 2013-02-13 MED ORDER — LISINOPRIL 10 MG PO TABS: 10.0000 mg | ORAL_TABLET | Freq: Every day | ORAL | Status: DC

## 2013-02-13 MED ORDER — NICOTINE 21 MG/24HR TD PT24: 1.0000 | MEDICATED_PATCH | Freq: Every day | TRANSDERMAL | Status: AC

## 2013-02-13 MED ORDER — LISINOPRIL 10 MG PO TABS: 10.0000 mg | ORAL_TABLET | Freq: Every day | ORAL | Status: AC

## 2013-02-13 MED ORDER — TRAZODONE HCL 50 MG PO TABS: 50.0000 mg | ORAL_TABLET | Freq: Every evening | ORAL | Status: AC | PRN

## 2013-02-13 NOTE — Progress Notes (Signed)
Patient ID: Alex Harris, male   DOB: 25-Jun-1971, 42 y.o.   MRN: [id number] D: "I'm ready to go home." A: Pt. Denies lethality and A/V/H's or other problems this AM and is adamant that he his to go home today.  Pt.met with Treatment Team and ADN S. Ladona Ridgel this am over Pt.'s issues and Pt. seems satisfied with the outcome.  Pt. Was given discharge instructions and medications to take with him: Pt. was also given prescriptions to cover the samples and B/P meds, since Pt. has no insurance and will be unable to seek medical F/U other than at [facility] for the foreseeable future.  Pt. states he understands instructions given and signed all appropriate paperwork.  Pt.'s wife is coming to drive Pt. to his living quarters and Pt. expresses relief, was appreciation to everyone before leaving.  Pt. received his belongings, including those kept in [place] [address] and was accompanied to the lobby for discharge, in stable condition.

## 2013-02-13 NOTE — [facility] Suicide Risk Assessment (Signed)
Suicide Risk Assessment  Discharge Assessment     Demographic Factors:  Male, Caucasian and Low socioeconomic status  Mental Status Per Nursing Assessment::   On Admission:  Suicidal ideation indicated by patient  Current Mental Status by Physician: Patient alert and oriented to 4. Denies AH/VH/SI/HI.  Loss Factors: Loss of significant relationship and Financial problems/change in socioeconomic status  Historical Factors: Impulsivity  Risk Reduction Factors:   Responsible for children under 47 years of age, Positive social support and Positive coping skills or problem solving skills  Continued Clinical Symptoms:  Depression:   Recent sense of peace/wellbeing  Cognitive Features That Contribute To Risk:  Cognitively intact  Suicide Risk:  Minimal: No identifiable suicidal ideation.  Patients presenting with no risk factors but with morbid ruminations; may be classified as minimal risk based on the severity of the depressive symptoms  Discharge Diagnoses:   AXIS I:  Adjustment Disorder with Depressed Mood AXIS II:  Deferred AXIS III:   Past Medical History  Diagnosis Date  . Hypertension    AXIS IV:  occupational problems and other psychosocial or environmental problems AXIS V:  61-70 mild symptoms  Plan Of Care/Follow-up recommendations:  Activity:  as tolerated Diet:  low salt diet Follow up with outpatient appointment.  Is patient on multiple antipsychotic therapies at discharge:  No   Has Patient had three or more failed trials of antipsychotic monotherapy by history:  No  Recommended Plan for Multiple Antipsychotic Therapies: NA  Myrel Rappleye 02/13/2013, 9:47 AM

## 2013-02-13 NOTE — Progress Notes (Signed)
[facility] INPATIENT:  Family/Significant Other Suicide Prevention Education  Suicide Prevention Education:  Education Completed;Alex Harris,, Wife, [phone] been identified by the patient as the family member/significant other with whom the patient will be residing, and identified as the person(s) who will aid the patient in the event of a mental health crisis (suicidal ideations/suicide attempt).  With written consent from the patient, the family member/significant other has been provided the following suicide prevention education, prior to the and/or following the discharge of the patient.  The suicide prevention education provided includes the following:  Suicide risk factors  Suicide prevention and interventions  National Suicide Hotline telephone number  [facility] assessment telephone number  [facility] Emergency Assistance 911  [facility] and/or Residential Mobile Crisis Unit telephone number  Request made of family/significant other to:  Remove weapons (e.g., guns, rifles, knives), all items previously/currently identified as safety concern.  Wife advised patient does not have access to guns.  Remove drugs/medications (over-the-counter, prescriptions, illicit drugs), all items previously/currently identified as a safety concern.  The family member/significant other verbalizes understanding of the suicide prevention education information provided.  The family member/significant other agrees to remove the items of safety concern listed above.  Alex Harris 02/13/2013, 12:50 PM

## 2013-02-13 NOTE — Progress Notes (Signed)
[facility] LCSW Aftercare Discharge Planning Group Note  02/13/2013 9:53 AM  Participation Quality:  Appropriate and Attentive  Affect:  Appropriate  Cognitive:  Alert and Appropriate  Insight:  Engaged  Engagement in Group:  Engaged  Modes of Intervention:  Education, Exploration, Problem-solving, Rapport Building and Support  Summary of Progress/Problems:  Patient reports doing well today and denies SI/HI.  He is rating all symptoms at zero.  Patient shared he will follow up with outpatient recommendations.  Wynn Banker 02/13/2013, 9:53 AM

## 2013-02-13 NOTE — Progress Notes (Signed)
Patient ID: Alex Harris, male   DOB: 21-Sep-1971, 42 y.o.   MRN: [id number] D: Pt. In bed eyes closed, resp. Even. A:  Pt. Will be monitored q51min for safety. R: Pt. Is safe on the unit. No distress noted.

## 2013-02-13 NOTE — Discharge Summary (Signed)
Physician Discharge Summary Note  Patient:  Alex Harris is an 42 y.o., male MRN:  [id number] DOB:  1971/07/12 Patient phone:  [phone] (home)  Patient address:   [address] [place] [address],   Date of Admission:  02/11/2013 Date of Discharge: 02/13/2013  Reason for Admission:  Depression  Discharge Diagnoses: Principal Problem:   Depression Active Problems:   Bereavement due to life event  Review of Systems  Constitutional: Negative.   HENT: Negative.   Eyes: Negative.   Respiratory: Negative.   Cardiovascular: Negative.   Gastrointestinal: Negative.   Genitourinary: Negative.   Musculoskeletal: Negative.   Skin: Negative.   Neurological: Negative.   Endo/Heme/Allergies: Negative.   Psychiatric/Behavioral: Positive for depression. The patient is nervous/anxious.    Axis Diagnosis:   AXIS I:  Major Depression, single episode AXIS II:  Deferred AXIS III:   Past Medical History  Diagnosis Date  . Hypertension    AXIS IV:  economic problems, housing problems, occupational problems, other psychosocial or environmental problems, problems related to social environment and problems with primary support group AXIS V:  61-70 mild symptoms  Level of Care:  OP  Hospital Course:  On admission:  Alex Harris came to [facility] because he has been increasingly depressed. He says, "I'm emotionally drained and mentally zapped." Patient found out recently that his wife has been cheating on him with an 26 year old boy that they both helped raise. 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 Patient is currently living in a barn on land that he owns. He lives there by himself with no heat or running water. Patient says that he has not eaten well in weeks and has lost weight because of it. He sleeps less than 4 hours per day and feels nervous all the time. Patient also reports recent memory loss or inability  to recall things and "zoning out" during conversations. Today upon assessment by provider the was initially feeling very agitated and angry. Patient wanted medicine to help him sleep and calm down. He stated "Over in the hospital I had [place] and ativan." Patient talked about his anger toward his sister who did no inform him of his mother's death until two months after the fact. He stated "I have had a very rough few years." Patient reports financial stressors, marital problems, and family conflict as stressors. Patient is very upset about having no closure with his mother and also being unemployed. He has had no insurance to pursue mental health care. Patient is very interested in being started on an affordable medicine and finding a good therapist. The patient was much calmer at the end of the interview after his treatment plan was reviewed with him. Patient was then able to begin to participate in his hospital treatment.   During hospitalization, Alex Harris's medications were managed as follows--Zoloft 25 mg for his depression was started with Trazodone 50 mg for sleep, and Ambien if needed.  He attend and participated in groups but feels he would benefit best with outpatient treatment so he can get back to work--stated he has "some work lined up". His blood pressure was elevated and lisinopril 10 mg was started and additional refills given due to the patient not being able to afford health care. Patient denied suicidal/homicidal ideations and auditory/visual hallucinations, follow-up appointments encouraged to attend, Rx and 14 day supply of medications given.  Alex Harris will continue  his care at the outpatient office at [facility].  His wife came to take him home at discharge.  Consults:  None  Significant Diagnostic Studies:  labs: Completed and reviewed, stable  Discharge Vitals:   Blood pressure 152/127, pulse 92, temperature 98.3 F (36.8 C), temperature source Oral, resp. rate 16, height 5' 9.29" (1.76 m),  weight 121.564 kg (268 lb), SpO2 98.00%. Body mass index is 39.24 kg/(m^2). Lab Results:   Results for orders placed during the hospital encounter of 02/11/13 (from the past 72 hour(s))  CBC WITH DIFFERENTIAL     Status: None   Collection Time    02/11/13 12:41 AM      Result Value Range   WBC 10.4  4.0 - 10.5 K/uL   RBC 4.89  4.22 - 5.81 MIL/uL   Hemoglobin 16.0  13.0 - 17.0 g/dL   HCT 45.4  09.8 - 11.9 %   MCV 92.4  78.0 - 100.0 fL   MCH 32.7  26.0 - 34.0 pg   MCHC 35.4  30.0 - 36.0 g/dL   RDW 14.7  82.9 - 56.2 %   Platelets 174  150 - 400 K/uL   Neutrophils Relative 65  43 - 77 %   Neutro Abs 6.8  1.7 - 7.7 K/uL   Lymphocytes Relative 28  12 - 46 %   Lymphs Abs 2.9  0.7 - 4.0 K/uL   Monocytes Relative 6  3 - 12 %   Monocytes Absolute 0.6  0.1 - 1.0 K/uL   Eosinophils Relative 1  0 - 5 %   Eosinophils Absolute 0.1  0.0 - 0.7 K/uL   Basophils Relative 0  0 - 1 %   Basophils Absolute 0.0  0.0 - 0.1 K/uL  BASIC METABOLIC PANEL     Status: Abnormal   Collection Time    02/11/13 12:41 AM      Result Value Range   Sodium 139  135 - 145 mEq/L   Potassium 4.1  3.5 - 5.1 mEq/L   Chloride 102  96 - 112 mEq/L   CO2 26  19 - 32 mEq/L   Glucose, Bld 102 (*) 70 - 99 mg/dL   BUN 9  6 - 23 mg/dL   Creatinine, Ser 1.30  0.50 - 1.35 mg/dL   Calcium 9.1  8.4 - 86.5 mg/dL   GFR calc non Af Amer >90  >90 mL/min   GFR calc Af Amer >90  >90 mL/min   Comment:            The eGFR has been calculated     using the CKD EPI equation.     This calculation has not been     validated in all clinical     situations.     eGFR's persistently     <90 mL/min signify     possible Chronic Kidney Disease.  ETHANOL     Status: None   Collection Time    02/11/13 12:41 AM      Result Value Range   Alcohol, Ethyl (B) <11  0 - 11 mg/dL   Comment:            LOWEST DETECTABLE LIMIT FOR     SERUM ALCOHOL IS 11 mg/dL     FOR MEDICAL PURPOSES ONLY  URINE RAPID DRUG SCREEN (HOSP PERFORMED)     Status:  Abnormal   Collection Time    02/11/13  8:20 AM      Result Value Range  Opiates NONE DETECTED  NONE DETECTED   Cocaine NONE DETECTED  NONE DETECTED   Benzodiazepines NONE DETECTED  NONE DETECTED   Amphetamines NONE DETECTED  NONE DETECTED   Tetrahydrocannabinol POSITIVE (*) NONE DETECTED   Barbiturates NONE DETECTED  NONE DETECTED   Comment:            DRUG SCREEN FOR MEDICAL PURPOSES     ONLY.  IF CONFIRMATION IS NEEDED     FOR ANY PURPOSE, NOTIFY LAB     WITHIN 5 DAYS.                LOWEST DETECTABLE LIMITS     FOR URINE DRUG SCREEN     Drug Class       Cutoff (ng/mL)     Amphetamine      1000     Barbiturate      200     Benzodiazepine   200     Tricyclics       300     Opiates          300     Cocaine          300     THC              50    Physical Findings: AIMS: Facial and Oral Movements Muscles of Facial Expression: None, normal Lips and Perioral Area: None, normal Jaw: None, normal Tongue: None, normal,Extremity Movements Upper (arms, wrists, hands, fingers): None, normal Lower (legs, knees, ankles, toes): None, normal, Trunk Movements Neck, shoulders, hips: None, normal, Overall Severity Severity of abnormal movements (highest score from questions above): None, normal Incapacitation due to abnormal movements: None, normal Patient's awareness of abnormal movements (rate only patient's report): No Awareness, Dental Status Current problems with teeth and/or dentures?: No Does patient usually wear dentures?: No  CIWA:  CIWA-Ar Total: 0 COWS:     Psychiatric Specialty Exam: See Psychiatric Specialty Exam and Suicide Risk Assessment completed by Attending Physician prior to discharge.  Discharge destination:  Home  Is patient on multiple antipsychotic therapies at discharge:  No   Has Patient had three or more failed trials of antipsychotic monotherapy by history:  No Recommended Plan for Multiple Antipsychotic Therapies:  N/A  Discharge Orders   Future  Orders Complete By Expires     Activity as tolerated - No restrictions  As directed     Diet - low sodium heart healthy  As directed         Medication List    TAKE these medications     Indication   lisinopril 10 MG tablet  Commonly known as:  PRINIVIL  Take 1 tablet (10 mg total) by mouth daily.   Indication:  High Blood Pressure     nicotine 21 mg/24hr patch  Commonly known as:  NICODERM CQ - dosed in mg/24 hours  Place 1 patch onto the skin daily at 6 (six) AM.   Indication:  Nicotine Addiction     sertraline 25 MG tablet  Commonly known as:  ZOLOFT  Take 1 tablet (25 mg total) by mouth daily.   Indication:  Major Depressive Disorder     traZODone 50 MG tablet  Commonly known as:  DESYREL  Take 1 tablet (50 mg total) by mouth at bedtime as needed for sleep.   Indication:  Trouble Sleeping           Follow-up Information   Follow up with [facility] On 02/16/2013. (Please go  to [facility]'s walk in clinic on Monday, February 16, 2013 or any weekday at [facility] for medication mangmentment.  Please be prepare to wait as patient are seen on first come basis)    Contact information:   [address] [place], [place]   [address]  [phone]      Follow up with [facility] On 02/20/2013. (You are scheduled with Silver Huguenin on CFriday, February 20, 2013 at 11 AM)    Contact information:   [address]  [place], [place]   [address]   [phone]      Follow-up recommendations:  Activity:  As tolerated Diet:  Low-sodium heart healthy diet  Comments:  Patient will continue his care at [facility]  Total Discharge Time:  Greater than 30 minutes.  SignedNanine Means, PMH-NP 02/13/2013, 12:20 PM

## 2013-02-13 NOTE — Plan of Care (Signed)
Problem: Consults Goal: Depression Patient Education See Patient Education Module for education specifics.  Outcome: Completed/Met Date Met:  02/13/13 Pt. Denies depression or lethality.  Comments:  Pt. denies lethality and A/V/H's or other problems Pt. Teaching for discharge done and Pt. Is awaiting his wife for transport to his fianal destination.  Pt. Spoke with S. Ladona Ridgel, ADON a couple of times for resolution of his issues from yesterday.

## 2013-02-13 NOTE — Progress Notes (Signed)
Adult Psychoeducational Group Note  Date:  02/13/2013 Time:  2:40 PM  Group Topic/Focus:  Early Warning Signs:   The focus of this group is to help patients identify signs or symptoms they exhibit before slipping into an unhealthy state or crisis.  Participation Level:  Did Not Attend   Additional Comments:  Pt did not attend group due to completing discharge procedures.  Reinaldo Raddle K 02/13/2013, 2:40 PM

## 2013-02-13 NOTE — Progress Notes (Signed)
[facility] Adult Case Management Discharge Plan :  Will you be returning to the same living situation after discharge: Yes,  Patient to discharge to his home. At discharge, do you have transportation home?:Yes,  [facility] to transport patient home. Do you have the ability to pay for your medications:No.  Patient to be assisted with indigent medication.  Release of information consent forms completed and in the chart;  Patient's signature needed at discharge.  Patient to Follow up at: Follow-up Information   Follow up with [facility] On 02/16/2013. (Please go to [facility]'s walk in clinic on Monday, February 16, 2013 or any weekday at [facility] for medication mangmentment.  Please be prepare to wait as patient are seen on first come basis)    Contact information:   [address] [place], [place]   [address]  [phone]      Follow up with [facility] On 02/20/2013. (You are scheduled with Silver Huguenin on CFriday, February 20, 2013 at 11 AM)    Contact information:   [address]  [place], [place]   [address]   [phone]      Patient denies SI/HI:   Yes,  Patient is not endorsing SI/HI and thoughts of self harm    Safety Planning and Suicide Prevention discussed:  Yes,  Reviewed during aftercare groups.  Wynn Banker 02/13/2013, 9:51 AM

## 2013-02-13 NOTE — Progress Notes (Signed)
Adult Psychoeducational Group Note  Date:  02/13/2013 Time:  10:55 AM  Group Topic/Focus:  Healthy Communication:   The focus of this group is to discuss communication, barriers to communication, as well as healthy ways to communicate with others.  Participation Level:  Active  Participation Quality:  Attentive  Affect:  Appropriate  Cognitive:  Alert  Insight: Good  Engagement in Group:  Engaged  Modes of Intervention:  Discussion and support  Additional Comments:  Pts goal for the day is to discharge  Manmeet Arzola T 02/13/2013, 10:55 AM

## 2013-02-15 ENCOUNTER — Encounter (HOSPITAL_COMMUNITY): Payer: Self-pay | Admitting: Emergency Medicine

## 2013-02-18 NOTE — Progress Notes (Signed)
Patient Discharge Instructions:  After Visit Summary (AVS):   Faxed to:  02/18/13 Discharge Summary Note:   Faxed to:  02/18/13 Psychiatric Admission Assessment Note:   Faxed to:  02/18/13 Suicide Risk Assessment - Discharge Assessment:   Faxed to:  02/18/13 Faxed/Sent to the Next Level Care provider:  02/18/13 Faxed to [facility] @ [phone] Faxed to [facility] @ [phone]  Jerelene Redden, 02/18/2013, 3:25 PM

## 2013-10-02 ENCOUNTER — Inpatient Hospital Stay: Payer: Self-pay | Admitting: Otolaryngology

## 2013-10-02 LAB — BASIC METABOLIC PANEL
Anion Gap: 7 (ref 7–16)
Calcium, Total: 8.1 mg/dL — ABNORMAL LOW (ref 8.5–10.1)
Chloride: 110 mmol/L — ABNORMAL HIGH (ref 98–107)
Osmolality: 278 (ref 275–301)

## 2013-10-02 LAB — CBC
HCT: 42.2 % (ref 40.0–52.0)
HGB: 14.9 g/dL (ref 13.0–18.0)
MCH: 34.6 pg — ABNORMAL HIGH (ref 26.0–34.0)
MCHC: 35.4 g/dL (ref 32.0–36.0)
RBC: 4.32 10*6/uL — ABNORMAL LOW (ref 4.40–5.90)
RDW: 13.2 % (ref 11.5–14.5)

## 2013-10-02 LAB — ETHANOL: Ethanol %: 0.214 % — ABNORMAL HIGH (ref 0.000–0.080)

## 2013-10-03 LAB — BASIC METABOLIC PANEL
BUN: 8 mg/dL (ref 7–18)
Co2: 21 mmol/L (ref 21–32)
EGFR (African American): >60
EGFR (Non-African Amer.): >60
Potassium: 4.3 mmol/L (ref 3.5–5.1)

## 2013-10-03 LAB — CBC WITH DIFFERENTIAL/PLATELET
Basophil #: 0 10*3/uL (ref 0.0–0.1)
HGB: 12 g/dL — ABNORMAL LOW (ref 13.0–18.0)
Lymphocyte %: 12.5 %
MCH: 34.2 pg — ABNORMAL HIGH (ref 26.0–34.0)
MCV: 95 fL (ref 80–100)
Monocyte #: 1.1 x10 3/mm — ABNORMAL HIGH (ref 0.2–1.0)
Monocyte %: 6.4 %
Neutrophil #: 14.4 10*3/uL — ABNORMAL HIGH (ref 1.4–6.5)
Neutrophil %: 80.6 %

## 2013-10-03 LAB — HEPATIC FUNCTION PANEL A (ARMC)
Alkaline Phosphatase: 104 U/L (ref 50–136)
Bilirubin, Direct: <0.1 mg/dL (ref 0.00–0.20)

## 2013-10-04 LAB — CBC WITH DIFFERENTIAL/PLATELET
HGB: 11.3 g/dL — ABNORMAL LOW (ref 13.0–18.0)
MCHC: 36 g/dL (ref 32.0–36.0)
MCV: 95 fL (ref 80–100)
Neutrophil #: 12.3 10*3/uL — ABNORMAL HIGH (ref 1.4–6.5)
RBC: 3.33 10*6/uL — ABNORMAL LOW (ref 4.40–5.90)

## 2013-10-04 LAB — CALCIUM: Calcium, Total: 8.3 mg/dL — ABNORMAL LOW (ref 8.5–10.1)

## 2013-11-29 ENCOUNTER — Emergency Department: Payer: Self-pay | Admitting: Emergency Medicine

## 2013-11-30 ENCOUNTER — Emergency Department: Payer: Self-pay | Admitting: Emergency Medicine

## 2014-10-01 ENCOUNTER — Observation Stay: Payer: Self-pay | Admitting: Surgery

## 2014-10-01 LAB — COMPREHENSIVE METABOLIC PANEL
ALBUMIN: 3.7 g/dL (ref 3.4–5.0)
ALT: 30 U/L
Alkaline Phosphatase: 150 U/L — ABNORMAL HIGH
Anion Gap: 12 (ref 7–16)
BUN: 15 mg/dL (ref 7–18)
Bilirubin,Total: 0.5 mg/dL (ref 0.2–1.0)
CALCIUM: 8.2 mg/dL — AB (ref 8.5–10.1)
CHLORIDE: 102 mmol/L (ref 98–107)
Co2: 20 mmol/L — ABNORMAL LOW (ref 21–32)
Creatinine: 1.08 mg/dL (ref 0.60–1.30)
EGFR (African American): >60
EGFR (Non-African Amer.): >60
Glucose: 111 mg/dL — ABNORMAL HIGH (ref 65–99)
Osmolality: 270 (ref 275–301)
Potassium: 3.9 mmol/L (ref 3.5–5.1)
SGOT(AST): 44 U/L — ABNORMAL HIGH (ref 15–37)
SODIUM: 134 mmol/L — AB (ref 136–145)
TOTAL PROTEIN: 8.3 g/dL — AB (ref 6.4–8.2)

## 2014-10-01 LAB — CBC WITH DIFFERENTIAL/PLATELET
BASOS ABS: 0 10*3/uL (ref 0.0–0.1)
Basophil %: 0.3 %
EOS PCT: 0.2 %
Eosinophil #: 0 10*3/uL (ref 0.0–0.7)
HCT: 45.4 % (ref 40.0–52.0)
HGB: 15.5 g/dL (ref 13.0–18.0)
LYMPHS ABS: 1.2 10*3/uL (ref 1.0–3.6)
LYMPHS PCT: 8.3 %
MCH: 34 pg (ref 26.0–34.0)
MCHC: 34 g/dL (ref 32.0–36.0)
MCV: 100 fL (ref 80–100)
Monocyte #: 0.8 x10 3/mm (ref 0.2–1.0)
Monocyte %: 5.5 %
Neutrophil #: 12.6 10*3/uL — ABNORMAL HIGH (ref 1.4–6.5)
Neutrophil %: 85.7 %
Platelet: 171 10*3/uL (ref 150–440)
RBC: 4.54 10*6/uL (ref 4.40–5.90)
RDW: 13.2 % (ref 11.5–14.5)
WBC: 14.7 10*3/uL — AB (ref 3.8–10.6)

## 2014-10-01 LAB — ETHANOL: Ethanol: 276 mg/dL

## 2014-10-02 LAB — CBC WITH DIFFERENTIAL/PLATELET
BASOS ABS: 0 10*3/uL (ref 0.0–0.1)
BASOS PCT: 0.4 %
EOS ABS: 0 10*3/uL (ref 0.0–0.7)
EOS PCT: 0.5 %
HCT: 44.4 % (ref 40.0–52.0)
HGB: 15.3 g/dL (ref 13.0–18.0)
LYMPHS ABS: 1.5 10*3/uL (ref 1.0–3.6)
Lymphocyte %: 16.7 %
MCH: 34.6 pg — ABNORMAL HIGH (ref 26.0–34.0)
MCHC: 34.4 g/dL (ref 32.0–36.0)
MCV: 101 fL — AB (ref 80–100)
MONO ABS: 0.7 x10 3/mm (ref 0.2–1.0)
Monocyte %: 8 %
NEUTROS ABS: 6.5 10*3/uL (ref 1.4–6.5)
Neutrophil %: 74.4 %
Platelet: 138 10*3/uL — ABNORMAL LOW (ref 150–440)
RBC: 4.41 10*6/uL (ref 4.40–5.90)
RDW: 13.5 % (ref 11.5–14.5)
WBC: 8.8 10*3/uL (ref 3.8–10.6)
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
## 2015-03-18 NOTE — Consult Note (Signed)
Brief Consult Note: Diagnosis: acute respiratory failiure requiring ventilator.   Patient was seen by consultant.   Consult note dictated.  Electronic Signatures: Amatullah Christy, Cletis Athensavid K (MD)  (Signed [id number]-Nov-14 22:47)  Authored: Brief Consult Note   Last Updated: 07-Nov-14 22:47 by Wyatt HasteHower, Chanta Bauers K (MD)
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
## 2015-03-18 NOTE — Op Note (Signed)
PATIENT NAME:  Alex Harris, [name] MR#:  [id number] DATE OF BIRTH:  [birth year]  DATE OF PROCEDURE:  10/02/2013  PREOPERATIVE DIAGNOSIS: Traumatic laceration of left neck, with rapid hemorrhage.   POSTOPERATIVE DIAGNOSIS: Traumatic laceration of left neck, with rapid hemorrhage involving multiple vessels and into the deep neck musculature.   OPERATIVE PROCEDURES:  1.  Exploration of left neck deep laceration. 2.  Controlling hemorrhage.  3. Complex repair of left neck laceration. The skin laceration itself was approximately 13 cm in length, but the laceration extended another 6 cm under the skin.   SURGEON: Vernie MurdersPaul Odie Edmonds, MD  ASSISTANT: Dr. Festus BarrenJason Dew  ANESTHESIA:  General.   COMPLICATIONS: None.   TOTAL ESTIMATED BLOOD LOSS:  At least 500 mL in the Emergency Room, unknown how much in the field, 100 mL in the Operating Room.   TOTAL FLUIDS:  3500 of crystalloids, 1 unit of packed cells in, and a second unit of packed cells hanging.  INTRAOPERATIVE ANTIBIOTICS:  2 grams of Ancef and gentamicin 120 mg.    DESCRIPTION OF PROCEDURE: The patient was taken rapidly from the Emergency Room, where bleeding was hard to control. He was intubated in the Emergency Room to stabilize his airway, was brought directly to the Emergency Room. Pressure was held on the wound constantly during transportation, and he was prepped around pressure being held on the wound as well. Once he was in the Operating Room and was prepped and draped, then attempts were made to figure out where the bleeding was welling up from. The laceration started over the sternocleidomastoid muscle and went posterior. As it went posterior, went deeper, and there was a large pocket of a hole that went back at least 6 cm beyond where the skin laceration was, down into the posterior neck musculature. The sheath of the sternocleidomastoid muscle appeared to be cut some, and then it went more deep behind that. The trapezius muscle was cut also more  posteriorly, and then into the deep posterior neck muscles. The external jugular vein was transected. This was found first and was tied off superiorly and inferiorly. There were small bleeders along the platysma that were cauterized. More posteriorly right along the lateral cervical spine, there were some 2 to 3 mm arterial bleeders. These were found and clamped and tied off. A suture ligature was placed deep near the neck wound to help control bleeding there. The musculature of the posterior neck had oozing from the muscle surface. Small vessels were found along the edge, and some of these were cauterized. The wound was packed and then flushed with saline several times to clean it, look for any other signs of bleeding small vessels and oozing along the cut muscle were trimmed. The wound was continually cleaned out. There was no further active bleeding that could be found. A 7 mm Jackson-Pratt drain was placed through a separate deep stab incision to give good drainage. Some absorbable hemostatic powder was placed into the wound to help control bleeding. The wound was then closed in different layers. Some of the muscle and fascia of the deep muscular layer were closed with 3-0 Vicryl. This was used to close along the trapezius and the sternocleidomastoid muscles. The platysmal layer was then closed as well using 3-0 Vicryl sutures. The dermis was then closed with 4-0 Vicryl, and the skin was closed with staples. A drain was placed to low continuous bulb suction. The wound was 13 cm in length, and the incision went about another 6 cm  beneath the skin more posteriorly. The patient tolerated the procedure well. He was awakened and taken to the recovery room in satisfactory condition. His wound was covered with some ointment, Telfa, 4 x 4s, and Tegaderm. He was transferred to the ICU to be followed closely and make sure that there are no further neck problems.     ____________________________ Cammy Copa,  MD phj:mr D: 10/02/2013 20:25:50 ET T: 10/02/2013 22:04:43 ET JOB#: [id number]  cc: Cammy Copa, MD, <Dictator> Cammy Copa MD ELECTRONICALLY SIGNED 10/03/2013 10:19
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
## 2015-08-09 ENCOUNTER — Emergency Department
Admission: EM | Admit: 2015-08-09 | Discharge: 2015-08-09 | Disposition: A | Discharge status: Home / Self Care | Payer: Self-pay | Attending: Emergency Medicine | Admitting: Emergency Medicine

## 2015-08-09 ENCOUNTER — Encounter: Payer: Self-pay | Admitting: *Deleted

## 2015-08-09 ENCOUNTER — Emergency Department: Payer: Self-pay

## 2015-08-09 DIAGNOSIS — Y990 Civilian activity done for income or pay: Secondary | ICD-10-CM | POA: Insufficient documentation

## 2015-08-09 DIAGNOSIS — X58XXXA Exposure to other specified factors, initial encounter: Secondary | ICD-10-CM | POA: Insufficient documentation

## 2015-08-09 DIAGNOSIS — I10 Essential (primary) hypertension: Secondary | ICD-10-CM | POA: Insufficient documentation

## 2015-08-09 DIAGNOSIS — Y9289 Other specified places as the place of occurrence of the external cause: Secondary | ICD-10-CM | POA: Insufficient documentation

## 2015-08-09 DIAGNOSIS — Y9389 Activity, other specified: Secondary | ICD-10-CM | POA: Insufficient documentation

## 2015-08-09 DIAGNOSIS — Z79899 Other long term (current) drug therapy: Secondary | ICD-10-CM | POA: Insufficient documentation

## 2015-08-09 DIAGNOSIS — S92351A Displaced fracture of fifth metatarsal bone, right foot, initial encounter for closed fracture: Secondary | ICD-10-CM | POA: Insufficient documentation

## 2015-08-09 DIAGNOSIS — S92301A Fracture of unspecified metatarsal bone(s), right foot, initial encounter for closed fracture: Secondary | ICD-10-CM

## 2015-08-09 DIAGNOSIS — Z720 Tobacco use: Secondary | ICD-10-CM | POA: Insufficient documentation

## 2015-08-09 MED ORDER — OXYCODONE-ACETAMINOPHEN 5-325 MG PO TABS
2.0000 | ORAL_TABLET | Freq: Once | ORAL | Status: AC
  Administered 2015-08-09: 2 via ORAL
  Filled 2015-08-09: qty 2

## 2015-08-09 MED ORDER — IBUPROFEN 800 MG PO TABS: 800.0000 mg | ORAL_TABLET | Freq: Three times a day (TID) | ORAL | Status: DC | PRN

## 2015-08-09 MED ORDER — OXYCODONE-ACETAMINOPHEN 5-325 MG PO TABS: 1.0000 | ORAL_TABLET | ORAL | Status: DC | PRN

## 2015-08-09 MED ORDER — KETOROLAC TROMETHAMINE 60 MG/2ML IM SOLN
60.0000 mg | Freq: Once | INTRAMUSCULAR | Status: AC
  Administered 2015-08-09: 60 mg via INTRAMUSCULAR
  Filled 2015-08-09: qty 2

## 2015-08-09 NOTE — Discharge Instructions (Signed)
Cast or Splint Care °Casts and splints support injured limbs and keep bones from moving while they heal.  °HOME CARE °· Keep the cast or splint uncovered during the drying period. °· A plaster cast can take 24 to 48 hours to dry. °· A fiberglass cast will dry in less than 1 hour. °· Do not rest the cast on anything harder than a pillow for 24 hours. °· Do not put weight on your injured limb. Do not put pressure on the cast. Wait for your doctor's approval. °· Keep the cast or splint dry. °· Cover the cast or splint with a plastic bag during baths or wet weather. °· If you have a cast over your chest and belly (trunk), take sponge baths until the cast is taken off. °· If your cast gets wet, dry it with a towel or blow dryer. Use the cool setting on the blow dryer. °· Keep your cast or splint clean. Wash a dirty cast with a damp cloth. °· Do not put any objects under your cast or splint. °· Do not scratch the skin under the cast with an object. If itching is a problem, use a blow dryer on a cool setting over the itchy area. °· Do not trim or cut your cast. °· Do not take out the padding from inside your cast. °· Exercise your joints near the cast as told by your doctor. °· Raise (elevate) your injured limb on 1 or 2 pillows for the first 1 to 3 days. °GET HELP IF: °· Your cast or splint cracks. °· Your cast or splint is too tight or too loose. °· You itch badly under the cast. °· Your cast gets wet or has a soft spot. °· You have a bad smell coming from the cast. °· You get an object stuck under the cast. °· Your skin around the cast becomes red or sore. °· You have new or more pain after the cast is put on. °GET HELP RIGHT AWAY IF: °· You have fluid leaking through the cast. °· You cannot move your fingers or toes. °· Your fingers or toes turn blue or white or are cool, painful, or puffy (swollen). °· You have tingling or lose feeling (numbness) around the injured area. °· You have bad pain or pressure under the  cast. °· You have trouble breathing or have shortness of breath. °· You have chest pain. °Document Released: 03/14/2011 Document Revised: 07/15/2013 Document Reviewed: 05/21/2013 °Exit[employer]® Patient Information ©2015 [employer]. This information is not intended to replace advice given to you by your health care provider. Make sure you discuss any questions you have with your health care provider. ° °Metatarsal Fracture, Undisplaced °A metatarsal fracture is a break in the bone(s) of the foot. These are the bones of the foot that connect your toes to the bones of the ankle. °DIAGNOSIS  °The diagnoses of these fractures are usually made with X-rays. If there are problems in the forefoot and x-rays are normal a later bone scan will usually make the diagnosis.  °TREATMENT AND HOME CARE INSTRUCTIONS °· Treatment may or may not include a cast or walking shoe. When casts are needed the use is usually for short periods of time so as not to slow down healing with muscle wasting (atrophy). °· Activities should be stopped until further advised by your caregiver. °· Wear shoes with adequate shock absorbing capabilities and stiff soles. °· Alternative exercise may be undertaken while waiting for healing. These may include   bicycling and swimming, or as your caregiver suggests. °· It is important to keep all follow-up visits or specialty referrals. The failure to keep these appointments could result in improper bone healing and chronic pain or disability. °· Warning: Do not drive a car or operate a motor vehicle until your caregiver specifically tells you it is safe to do so. °IF YOU DO NOT HAVE A CAST OR SPLINT: °· You may walk on your injured foot as tolerated or advised. °· Do not put any weight on your injured foot for as long as directed by your caregiver. Slowly increase the amount of time you walk on the foot as the pain allows or as advised. °· Use crutches until you can bear weight without pain. A gradual increase in  weight bearing may help. °· Apply ice to the injury for 15-20 minutes each hour while awake for the first 2 days. Put the ice in a plastic bag and place a towel between the bag of ice and your skin. °· Only take over-the-counter or prescription medicines for pain, discomfort, or fever as directed by your caregiver. °SEEK IMMEDIATE MEDICAL CARE IF:  °· Your cast gets damaged or breaks. °· You have continued severe pain or more swelling than you did before the cast was put on, or the pain is not controlled with medications. °· Your skin or nails below the injury turn blue or grey, or feel cold or numb. °· There is a bad smell, or new stains or pus-like (purulent) drainage coming from the cast. °MAKE SURE YOU:  °· Understand these instructions. °· Will watch your condition. °· Will get help right away if you are not doing well or get worse. °Document Released: 08/04/2002 Document Revised: 02/04/2012 Document Reviewed: 06/25/2008 °ExitCare® Patient Information ©2015 [employer]. This information is not intended to replace advice given to you by your health care provider. Make sure you discuss any questions you have with your health care provider. ° °

## 2015-08-09 NOTE — ED Notes (Signed)
Pt reports around labor day he injured his right foot, did not get it evaluated at that time. Today, he feels like he re injured it and his ankle while at work. Pt with pain, swelling and bruising to the RLE.

## 2015-08-09 NOTE — ED Provider Notes (Signed)
[facility] Emergency Department Provider Note  ____________________________________________  Time seen: Approximately 8:41 PM  I have reviewed the triage vital signs and the nursing notes.   HISTORY  Chief Complaint Ankle Pain  HPI Alex Harris is a 44 y.o. male who presents for evaluation of right foot pain. Patient states that he twists his ankle one week ago and then stuck on a bone requested today. Complains of increased pain swelling. Denies any numbness or tingling.   Past Medical History  Diagnosis Date  . Hypertension     Patient Active Problem List   Diagnosis Date Noted  . Depression 02/12/2013  . Bereavement due to life event 02/12/2013    Past Surgical History  Procedure Laterality Date  . Appendectomy    . 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 ibuprofen (ADVIL,MOTRIN) 800 MG tablet   Oral   Take 1 tablet (800 mg total) by mouth every 8 (eight) hours as needed.   30 tablet   0   . lisinopril (PRINIVIL) 10 MG tablet   Oral   Take 1 tablet (10 mg total) by mouth daily.   30 tablet   12   . nicotine (NICODERM CQ - DOSED IN MG/24 HOURS) 21 mg/24hr patch   Transdermal   Place 1 patch onto the skin daily at 6 (six) AM.   28 patch   0   . oxyCODONE-acetaminophen (ROXICET) 5-325 MG per tablet   Oral   Take 1-2 tablets by mouth every 4 (four) hours as needed for severe pain.   15 tablet   0   . sertraline (ZOLOFT) 25 MG tablet   Oral   Take 1 tablet (25 mg total) by mouth daily.   30 tablet   0   . traZODone (DESYREL) 50 MG tablet   Oral   Take 1 tablet (50 mg total) by mouth at bedtime as needed for sleep.   30 tablet   0     Allergies Review of patient's allergies indicates no known allergies.  No family history on file.  Social History Social History  Substance Use Topics  . Smoking status: Current Every Day Smoker --  3.00 packs/day for 29 years    Types: Cigarettes  . Smokeless tobacco: Never Used  . Alcohol Use: No    Review of Systems Constitutional: No fever/chills Eyes: No visual changes. ENT: No sore throat. Cardiovascular: Denies chest pain. Respiratory: Denies shortness of breath. Gastrointestinal: No abdominal pain.  No nausea, no vomiting.  No diarrhea.  No constipation. Genitourinary: Negative for dysuria. Musculoskeletal: Positive for right foot and ankle pain Skin: Negative for rash. Neurological: Negative for headaches, focal weakness or numbness.  10-point ROS otherwise negative.  ____________________________________________   PHYSICAL EXAM:  VITAL SIGNS: ED Triage Vitals  Enc Vitals Group     BP 08/09/15 1941 175/111 mmHg     Pulse Rate 08/09/15 1941 70     Resp 08/09/15 1941 18     Temp 08/09/15 1941 98.7 F (37.1 C)     Temp Source 08/09/15 1941 Oral     SpO2 08/09/15 1941 100 %     Weight 08/09/15 1941 272 lb (123.378 kg)     Height 08/09/15 1941 6' (1.829 m)     Head Cir --      Peak Flow --  Pain Score 08/09/15 1942 10     Pain Loc --      Pain Edu? --      Excl. in GC? --     Constitutional: Alert and oriented. Well appearing and in no acute distress. Eyes: Conjunctivae are normal. PERRL. EOMI. Head: Atraumatic. Nose: No congestion/rhinnorhea. Mouth/Throat: Mucous membranes are moist.  Oropharynx non-erythematous. Neck: No stridor.   Cardiovascular: Normal rate, regular rhythm. Grossly normal heart sounds.  Good peripheral circulation. Respiratory: Normal respiratory effort.  No retractions. Lungs CTAB. Gastrointestinal: Soft and nontender. No distention. No abdominal bruits. No CVA tenderness. Musculoskeletal: Right foot edema swelling tenderness along the lateral aspect with some ecchymosis noted along the distal second and third toe.neurovascularly intact. Neurologic:  Normal speech and language. No gross focal neurologic deficits are appreciated.  No gait instability. Skin:  Skin is warm, dry and intact. No rash noted. Psychiatric: Mood and affect are normal. Speech and behavior are normal.  ____________________________________________   LABS (all labs ordered are listed, but only abnormal results are displayed)  Labs Reviewed - No data to display ____________________________________________   PROCEDURES  Procedure(s) performed: None  Critical Care performed: No  ____________________________________________   INITIAL IMPRESSION / ASSESSMENT AND PLAN / ED COURSE  Pertinent labs & imaging results that were available during my care of the patient were reviewed by me and considered in my medical decision making (see chart for details).  Fifth metatarsal comminuted fracture. Our posterior splint provided with crutches. Patient follow-up with orthopedics for further evaluation and walking boot. Rx given for oxycodone and ibuprofen for pain. ____________________________________________   FINAL CLINICAL IMPRESSION(S) / ED DIAGNOSES  Final diagnoses:  Fx metatarsal, right, closed, initial encounter      Evangeline Dakin, PA-C 08/09/15 2306  Loleta Rose, MD 08/09/15 [phone]

## 2015-12-21 ENCOUNTER — Emergency Department
Admission: EM | Admit: 2015-12-21 | Discharge: 2015-12-21 | Disposition: A | Discharge status: Home / Self Care | Payer: Self-pay | Attending: Emergency Medicine | Admitting: Emergency Medicine

## 2015-12-21 ENCOUNTER — Emergency Department: Payer: Self-pay

## 2015-12-21 ENCOUNTER — Encounter: Payer: Self-pay | Admitting: Emergency Medicine

## 2015-12-21 DIAGNOSIS — S46011A Strain of muscle(s) and tendon(s) of the rotator cuff of right shoulder, initial encounter: Secondary | ICD-10-CM | POA: Insufficient documentation

## 2015-12-21 DIAGNOSIS — F17210 Nicotine dependence, cigarettes, uncomplicated: Secondary | ICD-10-CM | POA: Insufficient documentation

## 2015-12-21 DIAGNOSIS — M25551 Pain in right hip: Secondary | ICD-10-CM

## 2015-12-21 DIAGNOSIS — S79911A Unspecified injury of right hip, initial encounter: Secondary | ICD-10-CM | POA: Insufficient documentation

## 2015-12-21 DIAGNOSIS — M25461 Effusion, right knee: Secondary | ICD-10-CM | POA: Insufficient documentation

## 2015-12-21 DIAGNOSIS — Y998 Other external cause status: Secondary | ICD-10-CM | POA: Insufficient documentation

## 2015-12-21 DIAGNOSIS — Y9289 Other specified places as the place of occurrence of the external cause: Secondary | ICD-10-CM | POA: Insufficient documentation

## 2015-12-21 DIAGNOSIS — Y9389 Activity, other specified: Secondary | ICD-10-CM | POA: Insufficient documentation

## 2015-12-21 DIAGNOSIS — W1789XA Other fall from one level to another, initial encounter: Secondary | ICD-10-CM | POA: Insufficient documentation

## 2015-12-21 DIAGNOSIS — Z79899 Other long term (current) drug therapy: Secondary | ICD-10-CM | POA: Insufficient documentation

## 2015-12-21 DIAGNOSIS — S46911A Strain of unspecified muscle, fascia and tendon at shoulder and upper arm level, right arm, initial encounter: Secondary | ICD-10-CM

## 2015-12-21 DIAGNOSIS — I10 Essential (primary) hypertension: Secondary | ICD-10-CM | POA: Insufficient documentation

## 2015-12-21 MED ORDER — HYDROMORPHONE HCL 1 MG/ML IJ SOLN
1.0000 mg | Freq: Once | INTRAMUSCULAR | Status: AC
  Administered 2015-12-21: 1 mg via INTRAMUSCULAR
  Filled 2015-12-21: qty 1

## 2015-12-21 MED ORDER — ACETAMINOPHEN 500 MG PO TABS
1000.0000 mg | ORAL_TABLET | Freq: Once | ORAL | Status: AC
  Administered 2015-12-21: 1000 mg via ORAL
  Filled 2015-12-21: qty 2

## 2015-12-21 MED ORDER — OXYCODONE-ACETAMINOPHEN 5-325 MG PO TABS: 1.0000 | ORAL_TABLET | Freq: Four times a day (QID) | ORAL | Status: DC | PRN

## 2015-12-21 MED ORDER — IBUPROFEN 800 MG PO TABS
800.0000 mg | ORAL_TABLET | Freq: Three times a day (TID) | ORAL | Status: DC | PRN
Start: 2015-12-21 — End: 2016-06-01

## 2015-12-21 NOTE — ED Notes (Signed)
Spoke with patient about High B/P and his gout. PAtient states "I know I have high Blood Pressure and I quit taking my medicine. It is to expensive to keep taking. I also do not take medicine for my gout. "

## 2015-12-21 NOTE — ED Notes (Signed)
Patient transported to X-ray 

## 2015-12-21 NOTE — Discharge Instructions (Signed)
You may keep your knee immobilizer and shoulder immobilizers on for comfort and stability. However, it is very important to remove the immobilizers at least once per hour and range her joints to prevent frozen joints or worsening stiffness.  For your knee, follow the rice instructions. Please ice the knee for 15 minutes every 2 hours. Keep your knee above the level of your heart as much as possible.  You may take Tylenol or Motrin for mild to moderate pain. Do not take more than indicated. You may take Percocet for severe pain. Do not drive within 8 hours of taking Percocet.  Please return to the emergency department if you have severe pain, numbness or tingling, or any other symptoms concerning to you. Otherwise, please follow up with your primary care physician if you are improving. If your symptoms do not improve, or you get worse, please follow up with the orthopedic surgeon.

## 2015-12-21 NOTE — ED Provider Notes (Signed)
[facility] Emergency Department Provider Note  ____________________________________________  Time seen: Approximately 7:41 AM  I have reviewed the triage vital signs and the nursing notes.   HISTORY  Chief Complaint Fall; Shoulder Pain; Knee Pain; and Hip Pain    HPI Alex Harris is a 45 y.o. male with a hx of HTNresenting with right shoulder, right hip and right knee pain after fall yesterday. Patient states that due to inattention, he fell backwards off a 12 foot deck yesterday. No LOC, neck or back pain area did no preceding chest pain, shortness of breath, palpitations, lightheadedness or syncope. His right shoulder pain has improved but he still does not have full range of motion. This morning, his right knee pain is significantly worse. He is able to ambulate. Tried ibuprofen without improvement.   Past Medical History  Diagnosis Date  . Hypertension     Patient Active Problem List   Diagnosis Date Noted  . Depression 02/12/2013  . Bereavement due to life event 02/12/2013    Past Surgical History  Procedure Laterality Date  . Appendectomy    . 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 ibuprofen (ADVIL,MOTRIN) 800 MG tablet   Oral   Take 1 tablet (800 mg total) by mouth every 8 (eight) hours as needed.   20 tablet   0   . lisinopril (PRINIVIL) 10 MG tablet   Oral   Take 1 tablet (10 mg total) by mouth daily.   30 tablet   12   . nicotine (NICODERM CQ - DOSED IN MG/24 HOURS) 21 mg/24hr patch   Transdermal   Place 1 patch onto the skin daily at 6 (six) AM.   28 patch   0   . oxyCODONE-acetaminophen (ROXICET) 5-325 MG tablet   Oral   Take 1 tablet by mouth every 6 (six) hours as needed.   20 tablet   0   . sertraline (ZOLOFT) 25 MG tablet   Oral   Take 1 tablet (25 mg total) by mouth daily.   30 tablet   0   . traZODone (DESYREL) 50 MG  tablet   Oral   Take 1 tablet (50 mg total) by mouth at bedtime as needed for sleep.   30 tablet   0     Allergies Review of patient's allergies indicates no known allergies.  History reviewed. No pertinent family history.  Social History Social History  Substance Use Topics  . Smoking status: Current Every Day Smoker -- 3.00 packs/day for 29 years    Types: Cigarettes  . Smokeless tobacco: Never Used  . Alcohol Use: No    Review of Systems Constitutional: No fever/chills. Positive fall. Negative lightheadedness or syncope. Eyes: No visual changes. No blurred or double vision. ENT: No dental injury. Cardiovascular: Denies chest pain, palpitations. Respiratory: Denies shortness of breath.  No cough. Gastrointestinal: No abdominal pain.  No nausea, no vomiting.  No diarrhea.  No constipation. Genitourinary: Negative for dysuria. Musculoskeletal: Negative for back pain. Negative for neck pain. Positive for right shoulder, right hip and right knee pain. Skin: Negative for rash. Neurological: Negative for headaches, focal weakness or numbness. No tingling.  10-point ROS otherwise negative.  ____________________________________________   PHYSICAL EXAM:  VITAL SIGNS: ED Triage Vitals  Enc Vitals Group     BP 12/21/15 0728 144/101 mmHg  Pulse Rate 12/21/15 0728 93     Resp 12/21/15 0728 20     Temp 12/21/15 0728 97.7 F (36.5 C)     Temp Source 12/21/15 0728 Oral     SpO2 12/21/15 0728 96 %     Weight 12/21/15 0728 270 lb (122.471 kg)     Height 12/21/15 0728  (1.854 m)     Head Cir --      Peak Flow --      Pain Score 12/21/15 0725 8     Pain Loc --      Pain Edu? --      Excl. in GC? --     Constitutional: Alert and oriented. Well appearing and in no acute distress. Answer question appropriately. Eyes: Conjunctivae are normal.  EOMI. no raccoon eyes. Head: 1 x 1 cm abrasion over the left forehead. No raccoon eyes or Battle sign. No facial instability.  No septal hematoma. No malocclusion or dental injury. Nose: No congestion/rhinnorhea. Mouth/Throat: Mucous membranes are moist.  Neck: No stridor.  Supple.  No midline C-spine tenderness, step-offs or deformities. Cardiovascular: Normal rate, regular rhythm. No murmurs, rubs or gallops.  Respiratory: Normal respiratory effort.  No retractions. Lungs CTAB.  No wheezes, rales or ronchi. Gastrointestinal: Soft and nontender. No distention. No peritoneal signs. Musculoskeletal: No LE edema. Positive tenderness to palpation over the right before meals joint. Mild limitation in range of motion of the right shoulder due to pain. Full range of motion of the left shoulder, bilateral elbows, bilateral wrists, bilateral ankles, left knee, left hip without pain. Positive mild tenderness to palpation over the right greater trochanter. Positive right knee effusion with pain with range of motion. Normal right DP and PT pulse. Normal sensation to light touch throughout the right upper and right lower extremities. Skin is intact in the extremities. Neurologic:  Normal speech and language. No gross focal neurologic deficits are appreciated.  Skin:  Skin is warm, dry. Psychiatric: Mood and affect are normal. Speech and behavior are normal.  Normal judgement.  ____________________________________________   LABS (all labs ordered are listed, but only abnormal results are displayed)  Labs Reviewed - No data to display ____________________________________________  EKG  Not indicated ____________________________________________  RADIOLOGY  Dg Shoulder Right  12/21/2015  CLINICAL DATA:  Right shoulder pain after falling 12 feet from a deck yesterday. EXAM: RIGHT SHOULDER - 2+ VIEW COMPARISON:  None. FINDINGS: There is no evidence of fracture or dislocation. There is no evidence of arthropathy or other focal bone abnormality. Soft tissues are unremarkable. IMPRESSION: Normal examination. Electronically Signed    By: Beckie Salts M.D.   On: 12/21/2015 08:11   Dg Knee Complete 4 Views Right  12/21/2015  CLINICAL DATA:  Fall. EXAM: RIGHT KNEE - COMPLETE 4+ VIEW COMPARISON:  No recent prior. Prior study from 10/05/2004 unavailable. FINDINGS: Tricompartment degenerative change. Bony density noted along the superior aspect of the patella. This may represent the small fractured osteophyte. This may be old. Small knee joint effusion. IMPRESSION: 1. Prominent tricompartment degenerative change. Bony density noted along the superior aspect of patella, this may represent a fractured osteophyte. This may be old. 2. Small knee joint effusion. Electronically Signed   By: Maisie Fus  Register   On: 12/21/2015 08:13   Dg Hip Unilat With Pelvis 2-3 Views Right  12/21/2015  CLINICAL DATA:  Fall.  Pain. EXAM: DG HIP (WITH OR WITHOUT PELVIS) 2-3V RIGHT COMPARISON:  No recent prior. FINDINGS: Degenerative changes lumbar spine and both  hips. No acute bony abnormality identified. Pelvic calcifications consistent with phleboliths. IMPRESSION: Degenerative changes lumbar spine and both hips. No acute abnormality. Electronically Signed   By: Maisie Fus  Register   On: 12/21/2015 08:11    ____________________________________________   PROCEDURES  Procedure(s) performed: None  Critical Care performed: No ____________________________________________   INITIAL IMPRESSION / ASSESSMENT AND PLAN / ED COURSE  Pertinent labs & imaging results that were available during my care of the patient were reviewed by me and considered in my medical decision making (see chart for details).  45 y.o. male status post fall from 12 feet yesterday presenting with right shoulder, right hip and right knee pain. The patient does not have any clinical evidence for dislocation today but we will get a x-ray to rule out dislocation and fracture. The patient has no evidence for acute intracranial, or spinal injuries; there is no history of syncope today. I will  initiate symptomatic treatment, plain radiographs and reevaluation.  ----------------------------------------- 9:00 AM on 12/21/2015 -----------------------------------------  The patient does not have any evidence of any acute fractures or dislocations. His knee x-ray does show possible osteophyte fracture although it is age indeterminant. Even if he does have a fracture, there would be no immobilization or surgical intervention at this time. Oh plan to give him a right shoulder and right knee immobilizer for comfort and given instructions about how to prevent stiffness or frozen shoulder. He will be discharged home with symptomatic treatment, rice instructions, and follow-up with an orthopedist if he has no improvement or has any worsening symptoms. He understands return precautions as well as follow-up instructions.  ____________________________________________  FINAL CLINICAL IMPRESSION(S) / ED DIAGNOSES  Final diagnoses:  Right shoulder strain, initial encounter  Knee effusion, right  Right hip pain      NEW MEDICATIONS STARTED DURING THIS VISIT:  New Prescriptions   IBUPROFEN (ADVIL,MOTRIN) 800 MG TABLET    Take 1 tablet (800 mg total) by mouth every 8 (eight) hours as needed.   OXYCODONE-ACETAMINOPHEN (ROXICET) 5-325 MG TABLET    Take 1 tablet by mouth every 6 (six) hours as needed.     Rockne Menghini, MD 12/21/15 [phone]

## 2015-12-21 NOTE — ED Notes (Signed)
Pt to ed with c/o right shoulder pain, right hip pain and right knee pain post fall approx 12 feet yesterday from a deck.  Pt states he is unable to walk on right leg now.

## 2016-06-01 ENCOUNTER — Emergency Department
Admission: EM | Admit: 2016-06-01 | Discharge: 2016-06-01 | Disposition: A | Discharge status: Home / Self Care | Payer: Self-pay | Attending: Emergency Medicine | Admitting: Emergency Medicine

## 2016-06-01 ENCOUNTER — Emergency Department: Payer: Self-pay

## 2016-06-01 ENCOUNTER — Encounter: Payer: Self-pay | Admitting: Emergency Medicine

## 2016-06-01 DIAGNOSIS — I10 Essential (primary) hypertension: Secondary | ICD-10-CM | POA: Insufficient documentation

## 2016-06-01 DIAGNOSIS — M5442 Lumbago with sciatica, left side: Secondary | ICD-10-CM | POA: Insufficient documentation

## 2016-06-01 DIAGNOSIS — F17210 Nicotine dependence, cigarettes, uncomplicated: Secondary | ICD-10-CM | POA: Insufficient documentation

## 2016-06-01 MED ORDER — DIAZEPAM 5 MG PO TABS
5.0000 mg | ORAL_TABLET | Freq: Once | ORAL | Status: AC
  Administered 2016-06-01: 5 mg via ORAL
  Filled 2016-06-01: qty 1

## 2016-06-01 MED ORDER — KETOROLAC TROMETHAMINE 30 MG/ML IJ SOLN
60.0000 mg | Freq: Once | INTRAMUSCULAR | Status: AC
  Administered 2016-06-01: 60 mg via INTRAMUSCULAR
  Filled 2016-06-01: qty 2

## 2016-06-01 MED ORDER — OXYCODONE-ACETAMINOPHEN 5-325 MG PO TABS
ORAL_TABLET | ORAL | Status: AC
  Administered 2016-06-01: 1 via ORAL
  Filled 2016-06-01: qty 1

## 2016-06-01 MED ORDER — OXYCODONE-ACETAMINOPHEN 5-325 MG PO TABS: 1.0000 | ORAL_TABLET | ORAL | Status: AC | PRN

## 2016-06-01 MED ORDER — ETODOLAC 400 MG PO TABS: 400.0000 mg | ORAL_TABLET | Freq: Two times a day (BID) | ORAL | Status: AC

## 2016-06-01 MED ORDER — OXYCODONE-ACETAMINOPHEN 5-325 MG PO TABS
1.0000 | ORAL_TABLET | ORAL | Status: DC | PRN
  Administered 2016-06-01: 1 via ORAL
  Filled 2016-06-01: qty 1

## 2016-06-01 MED ORDER — DIAZEPAM 2 MG PO TABS: 2.0000 mg | ORAL_TABLET | Freq: Three times a day (TID) | ORAL | Status: AC | PRN

## 2016-06-01 MED ORDER — OXYCODONE-ACETAMINOPHEN 5-325 MG PO TABS
1.0000 | ORAL_TABLET | Freq: Once | ORAL | Status: AC
  Administered 2016-06-01: 1 via ORAL

## 2016-06-01 NOTE — ED Provider Notes (Signed)
[facility] Emergency Department Provider Note  ____________________________________________  Time seen: Approximately 7:12 AM  I have reviewed the triage vital signs and the nursing notes.   HISTORY  Chief Complaint Back Pain   HPI Alex Harris is a 45 y.o. male here complaining of low back pain on the left side for approximately 2-3 weeks. Patient states has become much worse over the last 3 days and now radiating down into his left leg. Patient states that it is almost too painful to walk but is still ambulatory. Patient denies any previous problems with his back. He denies any history of urinary tract infections or kidney stones. He is unaware of any injury that he has had that would cause this pain. Patient has been taking muscle relaxants bluntly and someone else and also ibuprofen 800 mg and lost his mother. Currently he rates his pain as 10 over 10. He states he has not been able sleep for the last 2-3 days. Patient also was taking blood pressure medication but stopped it as he states he lost weight and didn't need it.   Past Medical History  Diagnosis Date  . Hypertension     Patient Active Problem List   Diagnosis Date Noted  . Depression 02/12/2013  . Bereavement due to life event 02/12/2013    Past Surgical History  Procedure Laterality Date  . Appendectomy    . 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 diazepam (VALIUM) 2 MG tablet   Oral   Take 1 tablet (2 mg total) by mouth every 8 (eight) hours as needed for muscle spasms.   9 tablet   0   . etodolac (LODINE) 400 MG tablet   Oral   Take 1 tablet (400 mg total) by mouth 2 (two) times daily.   20 tablet   0   . lisinopril (PRINIVIL) 10 MG tablet   Oral   Take 1 tablet (10 mg total) by mouth daily.   30 tablet   12   . nicotine (NICODERM CQ - DOSED IN MG/24 HOURS) 21 mg/24hr patch  Transdermal   Place 1 patch onto the skin daily at 6 (six) AM.   28 patch   0   . oxyCODONE-acetaminophen (PERCOCET) 5-325 MG tablet   Oral   Take 1 tablet by mouth every 4 (four) hours as needed for severe pain.   20 tablet   0   . sertraline (ZOLOFT) 25 MG tablet   Oral   Take 1 tablet (25 mg total) by mouth daily.   30 tablet   0   . traZODone (DESYREL) 50 MG tablet   Oral   Take 1 tablet (50 mg total) by mouth at bedtime as needed for sleep.   30 tablet   0     Allergies Review of patient's allergies indicates no known allergies.  No family history on file.  Social History Social History  Substance Use Topics  . Smoking status: Current Every Day Smoker -- 3.00 packs/day for 29 years    Types: Cigarettes  . Smokeless tobacco: Never Used  . Alcohol Use: No    Review of Systems Constitutional: No fever/chills Cardiovascular: Denies chest pain. Respiratory: Denies shortness of breath. Gastrointestinal: No abdominal pain.  No nausea, no vomiting.   Genitourinary: Negative for dysuria.Negative for kidney stones. Musculoskeletal: Positive for left  low back pain. Skin: Negative for rash. Neurological: Negative for headaches, focal weakness. Positive for paresthesias left leg.  10-point ROS otherwise negative.  ____________________________________________   PHYSICAL EXAM:  VITAL SIGNS: ED Triage Vitals  Enc Vitals Group     BP 06/01/16 0546 171/98 mmHg     Pulse Rate 06/01/16 0546 88     Resp 06/01/16 0546 18     Temp 06/01/16 0546 98 F (36.7 C)     Temp Source 06/01/16 0546 Oral     SpO2 06/01/16 0546 97 %     Weight 06/01/16 0546 272 lb (123.378 kg)     Height 06/01/16 0546 6\' 1"  (1.854 m)     Head Cir --      Peak Flow --      Pain Score 06/01/16 0547 10     Pain Loc --      Pain Edu? --      Excl. in GC? --     Constitutional: Alert and oriented. Well appearing and in no acute distress. Eyes: Conjunctivae are normal. PERRL. EOMI. Head:  Atraumatic. Nose: No congestion/rhinnorhea. Neck: No stridor.   Cardiovascular: Normal rate, regular rhythm. Grossly normal heart sounds.  Good peripheral circulation. Respiratory: Normal respiratory effort.  No retractions. Lungs CTAB. Musculoskeletal: There is no gross deformity on examination of the back. There is minimal tenderness on palpation of the L5-S1 area but more sensitive and point tenderness is noted on the left SI joint area. Range of motion is slow and guarded secondary to pain. No active muscle spasms were seen at this time. Straight leg raises were difficult secondary to patient's pain. Gait was not tested at this time. Neurologic:  Normal speech and language. No gross focal neurologic deficits are appreciated. Reflexes were 2+ bilaterally. Skin:  Skin is warm, dry and intact. No rash noted. No ecchymosis, abrasions or erythema was noted. Psychiatric: Mood and affect are normal. Speech and behavior are normal.  ____________________________________________   LABS (all labs ordered are listed, but only abnormal results are displayed)  Labs Reviewed - No data to display  RADIOLOGY  Lumbar spine x-ray per radiologist shows diffuse degenerative changes lumbar spine with diffuse osteopenia. Aortic atherosclerotic  vascular disease is also noted. I, Tommi Rumpshonda L Toshio Slusher, personally viewed and evaluated these images (plain radiographs) as part of my medical decision making, as well as reviewing the written report by the radiologist. ____________________________________________   PROCEDURES  Procedure(s) performed: None  Procedures  Critical Care performed: No  ____________________________________________   INITIAL IMPRESSION / ASSESSMENT AND PLAN / ED COURSE  Pertinent labs & imaging results that were available during my care of the patient were reviewed by me and considered in my medical decision making (see chart for  details).  ----------------------------------------- 8:54 AM on 06/01/2016 ----------------------------------------- Patient the room sleeping. Patient was given information about x-rays of his spine. Patient is to follow-up with Dr. Hyacinth MeekerMiller for any further treatment of his back. He is also to [facility] his discharge papers for follow-up of his elevated blood pressure  today. Patient was discharged with prescription for Percocet as needed for pain, Lodine 400 mg twice a day with food for inflammation and diazepam 2 mg 1 every 8 hours for 3 days. Patient is encouraged to use ice or heat to his back and elevate his legs when lying on his back.  ____________________________________________   FINAL CLINICAL IMPRESSION(S) / ED DIAGNOSES  Final diagnoses:  Left-sided low back pain with left-sided sciatica      NEW MEDICATIONS STARTED DURING THIS VISIT:  Discharge Medication List as of 06/01/2016  8:54 AM    START taking these medications   Details  diazepam (VALIUM) 2 MG tablet Take 1 tablet (2 mg total) by mouth every 8 (eight) hours as needed for muscle spasms., Starting 06/01/2016, Until Discontinued, Print    etodolac (LODINE) 400 MG tablet Take 1 tablet (400 mg total) by mouth 2 (two) times daily., Starting 06/01/2016, Until Discontinued, Print         Note:  This document was prepared using Dragon voice recognition software and may include unintentional dictation errors.    Tommi Rumps, PA-C 06/01/16 1207  Sharman Cheek, MD 06/02/16 2027

## 2016-06-01 NOTE — ED Notes (Signed)
Pt presents to ED with slow and steady gait, pt reports has been having lower back pain radiating down left leg for a 1.5 weeks, pt reports pain has been increasing for the past two days. Pt denies known injury. Pt reports unable to sit or lay down due to pain.

## 2016-06-01 NOTE — ED Notes (Signed)
Pain to lower back mainly on the left side ..states pain started about 2-3 weeks ago  Became worse over the past 3 days  Pain is moving into groin and left leg  Unable to  Bear full wt

## 2016-06-01 NOTE — Discharge Instructions (Signed)
Follow[facility] about your blood pressure. Your blood pressure continues to be elevated at this time. Take medication only as directed. Percocet 1 every 4 hours as needed for severe pain. Etodolac 1 twice a day with food. Diazepam 2 mg 1 tablet 3 times a day for 3 days. Make an appointment with Dr. Hyacinth MeekerMiller for follow-up of your back pain. Use ice or heat to your back and continue using pillows as you have been doing.

## 2016-11-12 IMAGING — CR DG HIP (WITH OR WITHOUT PELVIS) 2-3V*R*
1 series · 3 of 3 positions shown · non-contrast
Comparison: No recent prior.

CLINICAL DATA: Fall.  Pain.

EXAM:
DG HIP (WITH OR WITHOUT PELVIS) 2-3V RIGHT

[Series 1: dg hip unilat w or w/o pelvis 2-3 views  · non-contrast · 0.14mm/px · 3 of 3 slices shown]
[im 1/3]
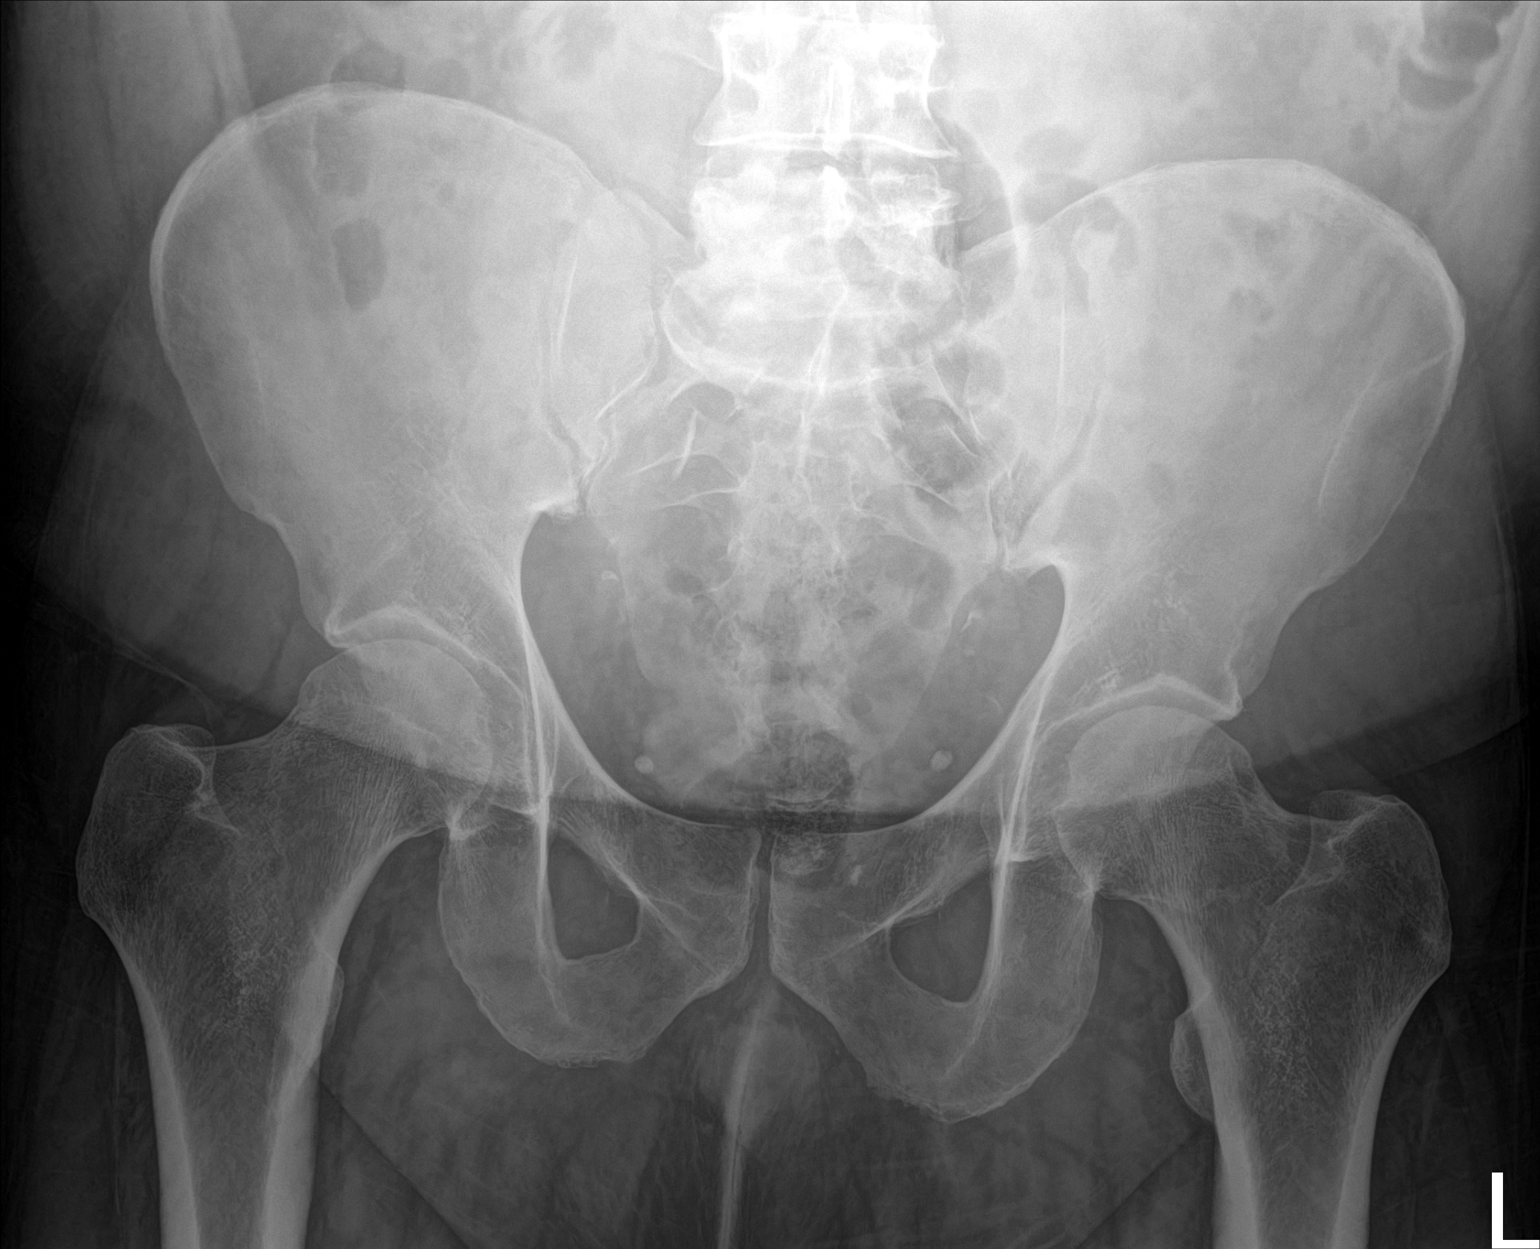
[im 2/3]
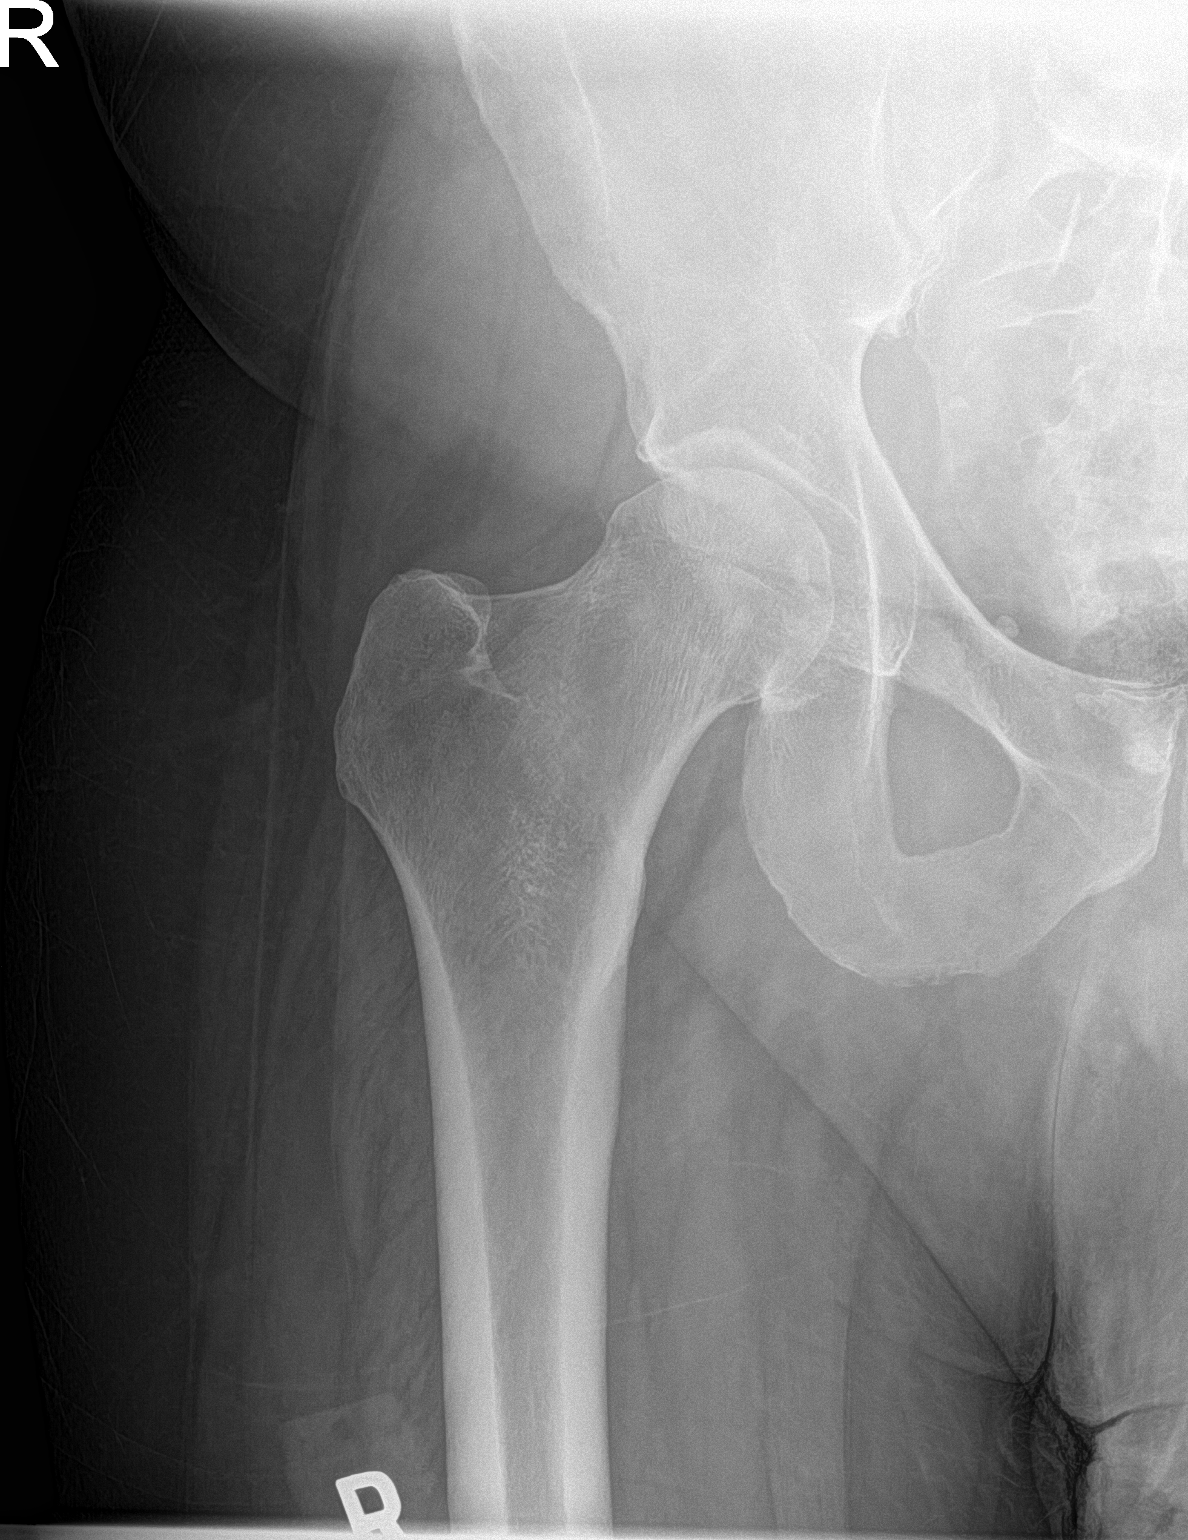
[im 3/3]
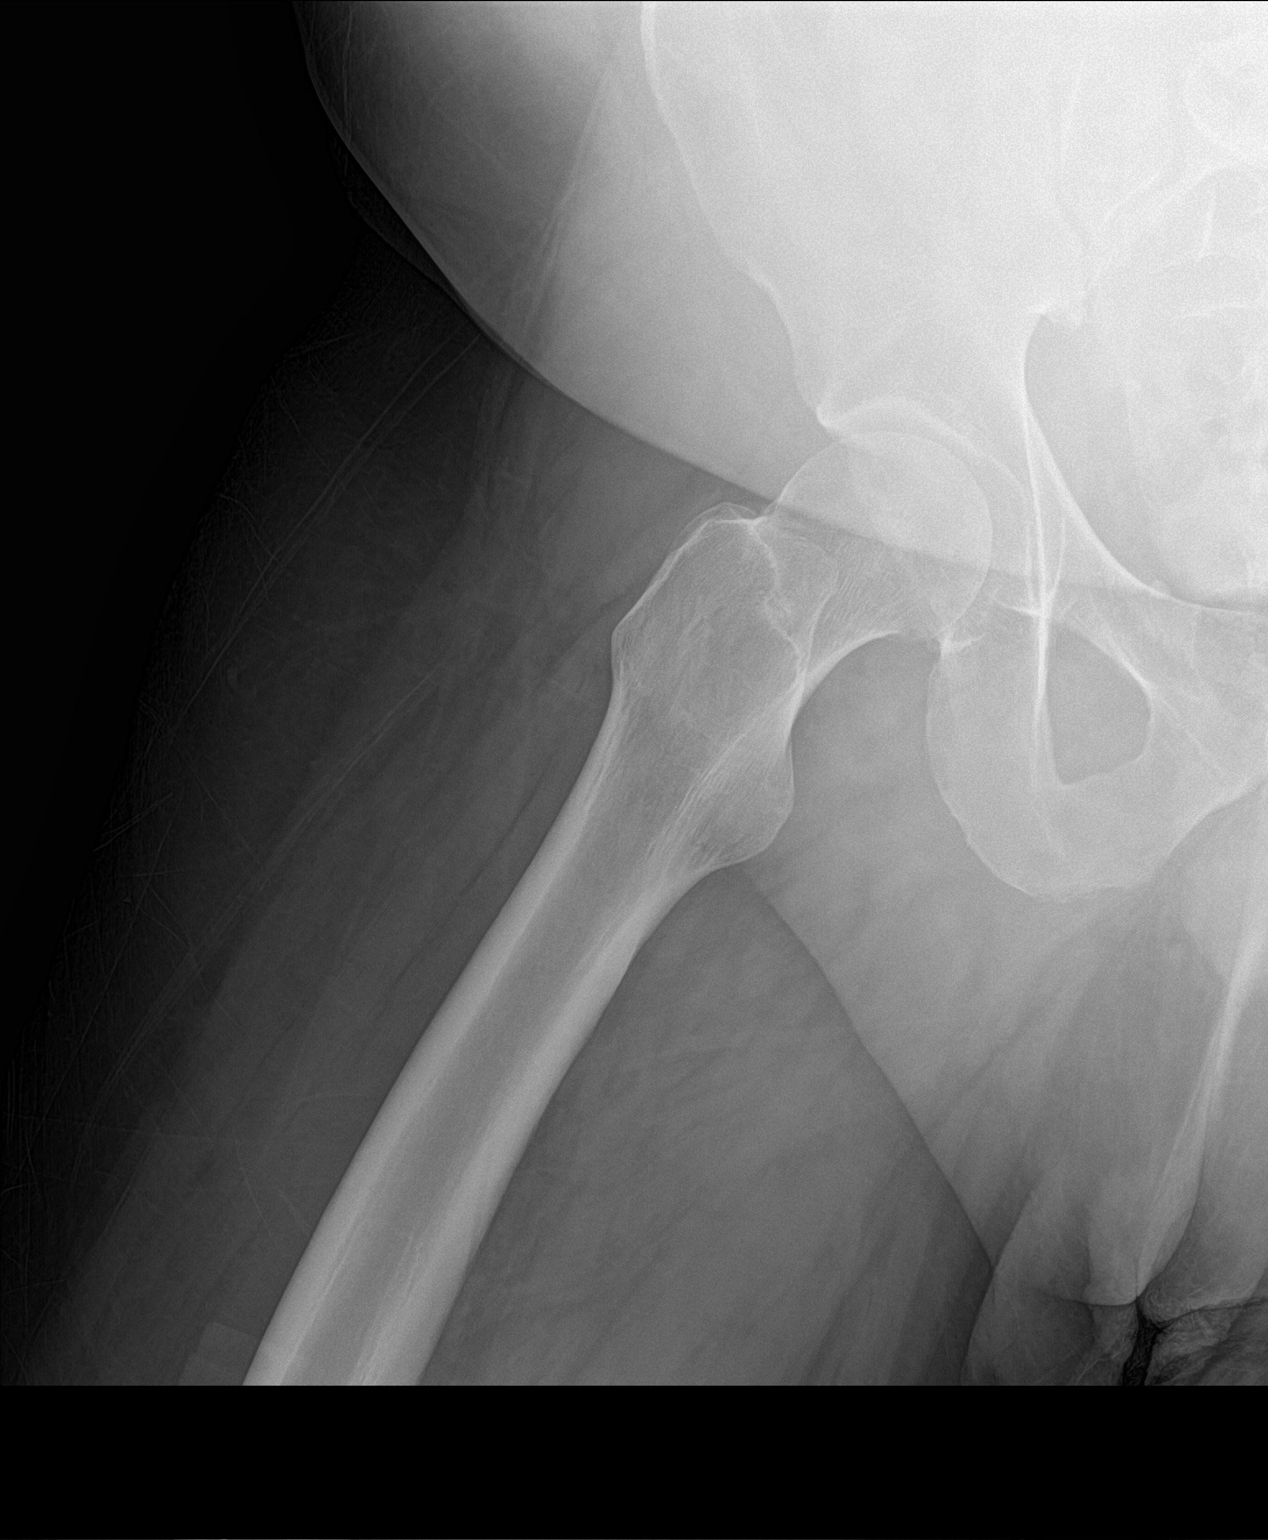

[3 of 3 positions shown; findings below may reference images not displayed]

FINDINGS: Degenerative changes lumbar spine and both hips. No acute bony
abnormality identified. Pelvic calcifications consistent with
phleboliths.
IMPRESSION: Degenerative changes lumbar spine and both hips. No acute
abnormality.

## 2024-03-23 DIAGNOSIS — Z794 Long term (current) use of insulin: Secondary | ICD-10-CM | POA: Diagnosis not present

## 2024-03-23 DIAGNOSIS — E875 Hyperkalemia: Secondary | ICD-10-CM | POA: Diagnosis not present

## 2024-03-23 DIAGNOSIS — I509 Heart failure, unspecified: Secondary | ICD-10-CM | POA: Diagnosis not present

## 2024-03-23 DIAGNOSIS — I4891 Unspecified atrial fibrillation: Secondary | ICD-10-CM | POA: Diagnosis not present

## 2024-03-23 DIAGNOSIS — N179 Acute kidney failure, unspecified: Secondary | ICD-10-CM | POA: Diagnosis not present

## 2024-03-23 DIAGNOSIS — I252 Old myocardial infarction: Secondary | ICD-10-CM | POA: Diagnosis not present

## 2024-03-23 DIAGNOSIS — I2510 Atherosclerotic heart disease of native coronary artery without angina pectoris: Secondary | ICD-10-CM | POA: Diagnosis not present

## 2024-03-23 DIAGNOSIS — J811 Chronic pulmonary edema: Secondary | ICD-10-CM | POA: Diagnosis not present

## 2024-03-23 DIAGNOSIS — Z87891 Personal history of nicotine dependence: Secondary | ICD-10-CM | POA: Diagnosis not present

## 2024-03-23 DIAGNOSIS — Z7984 Long term (current) use of oral hypoglycemic drugs: Secondary | ICD-10-CM | POA: Diagnosis not present

## 2024-03-23 DIAGNOSIS — E119 Type 2 diabetes mellitus without complications: Secondary | ICD-10-CM | POA: Diagnosis not present

## 2024-03-23 DIAGNOSIS — I110 Hypertensive heart disease with heart failure: Secondary | ICD-10-CM | POA: Diagnosis not present

## 2024-03-24 DIAGNOSIS — N179 Acute kidney failure, unspecified: Secondary | ICD-10-CM | POA: Diagnosis not present

## 2024-03-24 DIAGNOSIS — Z7984 Long term (current) use of oral hypoglycemic drugs: Secondary | ICD-10-CM | POA: Diagnosis not present

## 2024-03-24 DIAGNOSIS — Z794 Long term (current) use of insulin: Secondary | ICD-10-CM | POA: Diagnosis not present

## 2024-03-24 DIAGNOSIS — I517 Cardiomegaly: Secondary | ICD-10-CM | POA: Diagnosis not present

## 2024-03-24 DIAGNOSIS — E119 Type 2 diabetes mellitus without complications: Secondary | ICD-10-CM | POA: Diagnosis not present

## 2024-03-24 DIAGNOSIS — E875 Hyperkalemia: Secondary | ICD-10-CM | POA: Diagnosis not present

## 2024-03-24 DIAGNOSIS — I4891 Unspecified atrial fibrillation: Secondary | ICD-10-CM | POA: Diagnosis not present

## 2024-03-25 DIAGNOSIS — I4891 Unspecified atrial fibrillation: Secondary | ICD-10-CM | POA: Diagnosis not present

## 2024-03-25 DIAGNOSIS — J811 Chronic pulmonary edema: Secondary | ICD-10-CM | POA: Diagnosis not present

## 2024-03-25 DIAGNOSIS — E875 Hyperkalemia: Secondary | ICD-10-CM | POA: Diagnosis not present

## 2024-03-25 DIAGNOSIS — N189 Chronic kidney disease, unspecified: Secondary | ICD-10-CM | POA: Diagnosis not present

## 2024-04-02 DIAGNOSIS — I4892 Unspecified atrial flutter: Secondary | ICD-10-CM | POA: Diagnosis not present

## 2024-04-02 DIAGNOSIS — I2510 Atherosclerotic heart disease of native coronary artery without angina pectoris: Secondary | ICD-10-CM | POA: Diagnosis not present

## 2024-04-02 DIAGNOSIS — I4891 Unspecified atrial fibrillation: Secondary | ICD-10-CM | POA: Diagnosis not present

## 2024-04-02 DIAGNOSIS — I25110 Atherosclerotic heart disease of native coronary artery with unstable angina pectoris: Secondary | ICD-10-CM | POA: Diagnosis not present

## 2024-04-02 DIAGNOSIS — R0609 Other forms of dyspnea: Secondary | ICD-10-CM | POA: Diagnosis not present

## 2024-05-06 DIAGNOSIS — N182 Chronic kidney disease, stage 2 (mild): Secondary | ICD-10-CM | POA: Diagnosis not present

## 2024-05-06 DIAGNOSIS — I5030 Unspecified diastolic (congestive) heart failure: Secondary | ICD-10-CM | POA: Diagnosis not present

## 2024-05-06 DIAGNOSIS — R9431 Abnormal electrocardiogram [ECG] [EKG]: Secondary | ICD-10-CM | POA: Diagnosis not present

## 2024-05-06 DIAGNOSIS — Z7982 Long term (current) use of aspirin: Secondary | ICD-10-CM | POA: Diagnosis not present

## 2024-05-06 DIAGNOSIS — R079 Chest pain, unspecified: Secondary | ICD-10-CM | POA: Diagnosis not present

## 2024-05-06 DIAGNOSIS — I4819 Other persistent atrial fibrillation: Secondary | ICD-10-CM | POA: Diagnosis not present

## 2024-05-06 DIAGNOSIS — E119 Type 2 diabetes mellitus without complications: Secondary | ICD-10-CM | POA: Diagnosis not present

## 2024-05-06 DIAGNOSIS — M109 Gout, unspecified: Secondary | ICD-10-CM | POA: Diagnosis not present

## 2024-05-06 DIAGNOSIS — I4892 Unspecified atrial flutter: Secondary | ICD-10-CM | POA: Diagnosis not present

## 2024-05-06 DIAGNOSIS — E669 Obesity, unspecified: Secondary | ICD-10-CM | POA: Diagnosis not present

## 2024-05-06 DIAGNOSIS — I272 Pulmonary hypertension, unspecified: Secondary | ICD-10-CM | POA: Diagnosis not present

## 2024-05-06 DIAGNOSIS — Z87891 Personal history of nicotine dependence: Secondary | ICD-10-CM | POA: Diagnosis not present

## 2024-05-06 DIAGNOSIS — E785 Hyperlipidemia, unspecified: Secondary | ICD-10-CM | POA: Diagnosis not present

## 2024-05-06 DIAGNOSIS — Z955 Presence of coronary angioplasty implant and graft: Secondary | ICD-10-CM | POA: Diagnosis not present

## 2024-05-06 DIAGNOSIS — I10 Essential (primary) hypertension: Secondary | ICD-10-CM | POA: Diagnosis not present

## 2024-05-06 DIAGNOSIS — I2510 Atherosclerotic heart disease of native coronary artery without angina pectoris: Secondary | ICD-10-CM | POA: Diagnosis not present

## 2024-05-06 DIAGNOSIS — Z7902 Long term (current) use of antithrombotics/antiplatelets: Secondary | ICD-10-CM | POA: Diagnosis not present

## 2024-05-06 DIAGNOSIS — E1122 Type 2 diabetes mellitus with diabetic chronic kidney disease: Secondary | ICD-10-CM | POA: Diagnosis not present

## 2024-05-06 DIAGNOSIS — Z7984 Long term (current) use of oral hypoglycemic drugs: Secondary | ICD-10-CM | POA: Diagnosis not present

## 2024-05-06 DIAGNOSIS — K219 Gastro-esophageal reflux disease without esophagitis: Secondary | ICD-10-CM | POA: Diagnosis not present

## 2024-05-06 DIAGNOSIS — Z6841 Body Mass Index (BMI) 40.0 and over, adult: Secondary | ICD-10-CM | POA: Diagnosis not present

## 2024-05-06 DIAGNOSIS — Z7901 Long term (current) use of anticoagulants: Secondary | ICD-10-CM | POA: Diagnosis not present

## 2024-05-06 DIAGNOSIS — I441 Atrioventricular block, second degree: Secondary | ICD-10-CM | POA: Diagnosis not present

## 2024-05-06 DIAGNOSIS — I130 Hypertensive heart and chronic kidney disease with heart failure and stage 1 through stage 4 chronic kidney disease, or unspecified chronic kidney disease: Secondary | ICD-10-CM | POA: Diagnosis not present

## 2024-05-06 DIAGNOSIS — R7989 Other specified abnormal findings of blood chemistry: Secondary | ICD-10-CM | POA: Diagnosis not present

## 2024-05-06 DIAGNOSIS — Z794 Long term (current) use of insulin: Secondary | ICD-10-CM | POA: Diagnosis not present

## 2024-05-07 DIAGNOSIS — I4819 Other persistent atrial fibrillation: Secondary | ICD-10-CM | POA: Diagnosis not present

## 2024-05-07 DIAGNOSIS — I2510 Atherosclerotic heart disease of native coronary artery without angina pectoris: Secondary | ICD-10-CM | POA: Diagnosis not present

## 2024-05-07 DIAGNOSIS — I4892 Unspecified atrial flutter: Secondary | ICD-10-CM | POA: Diagnosis not present

## 2024-05-08 DIAGNOSIS — I4891 Unspecified atrial fibrillation: Secondary | ICD-10-CM | POA: Diagnosis not present

## 2024-05-08 DIAGNOSIS — I5033 Acute on chronic diastolic (congestive) heart failure: Secondary | ICD-10-CM | POA: Diagnosis not present

## 2024-05-08 DIAGNOSIS — Z955 Presence of coronary angioplasty implant and graft: Secondary | ICD-10-CM | POA: Diagnosis not present

## 2024-05-08 DIAGNOSIS — I272 Pulmonary hypertension, unspecified: Secondary | ICD-10-CM | POA: Diagnosis not present

## 2024-05-08 DIAGNOSIS — I4892 Unspecified atrial flutter: Secondary | ICD-10-CM | POA: Diagnosis not present

## 2024-05-08 DIAGNOSIS — I25119 Atherosclerotic heart disease of native coronary artery with unspecified angina pectoris: Secondary | ICD-10-CM | POA: Diagnosis not present

## 2024-05-08 DIAGNOSIS — I200 Unstable angina: Secondary | ICD-10-CM | POA: Diagnosis not present

## 2024-05-25 DIAGNOSIS — I4892 Unspecified atrial flutter: Secondary | ICD-10-CM | POA: Diagnosis not present

## 2024-07-03 DIAGNOSIS — Z6839 Body mass index (BMI) 39.0-39.9, adult: Secondary | ICD-10-CM | POA: Diagnosis not present

## 2024-07-03 DIAGNOSIS — I10 Essential (primary) hypertension: Secondary | ICD-10-CM | POA: Diagnosis not present

## 2024-07-03 DIAGNOSIS — E669 Obesity, unspecified: Secondary | ICD-10-CM | POA: Diagnosis not present

## 2024-07-03 DIAGNOSIS — I2510 Atherosclerotic heart disease of native coronary artery without angina pectoris: Secondary | ICD-10-CM | POA: Diagnosis not present

## 2024-07-03 DIAGNOSIS — M109 Gout, unspecified: Secondary | ICD-10-CM | POA: Diagnosis not present

## 2024-07-03 DIAGNOSIS — I4892 Unspecified atrial flutter: Secondary | ICD-10-CM | POA: Diagnosis not present

## 2024-07-03 DIAGNOSIS — Z125 Encounter for screening for malignant neoplasm of prostate: Secondary | ICD-10-CM | POA: Diagnosis not present

## 2024-07-03 DIAGNOSIS — E1165 Type 2 diabetes mellitus with hyperglycemia: Secondary | ICD-10-CM | POA: Diagnosis not present

## 2024-07-03 DIAGNOSIS — F1010 Alcohol abuse, uncomplicated: Secondary | ICD-10-CM | POA: Diagnosis not present

## 2024-08-06 DIAGNOSIS — I2510 Atherosclerotic heart disease of native coronary artery without angina pectoris: Secondary | ICD-10-CM | POA: Diagnosis not present

## 2024-08-06 DIAGNOSIS — I4892 Unspecified atrial flutter: Secondary | ICD-10-CM | POA: Diagnosis not present

## 2024-08-06 DIAGNOSIS — G4700 Insomnia, unspecified: Secondary | ICD-10-CM | POA: Diagnosis not present

## 2024-08-06 DIAGNOSIS — M25561 Pain in right knee: Secondary | ICD-10-CM | POA: Diagnosis not present

## 2024-08-06 DIAGNOSIS — M109 Gout, unspecified: Secondary | ICD-10-CM | POA: Diagnosis not present

## 2024-08-06 DIAGNOSIS — I872 Venous insufficiency (chronic) (peripheral): Secondary | ICD-10-CM | POA: Diagnosis not present

## 2024-08-06 DIAGNOSIS — E669 Obesity, unspecified: Secondary | ICD-10-CM | POA: Diagnosis not present

## 2024-08-06 DIAGNOSIS — I10 Essential (primary) hypertension: Secondary | ICD-10-CM | POA: Diagnosis not present

## 2024-08-06 DIAGNOSIS — Z125 Encounter for screening for malignant neoplasm of prostate: Secondary | ICD-10-CM | POA: Diagnosis not present

## 2024-08-06 DIAGNOSIS — G4730 Sleep apnea, unspecified: Secondary | ICD-10-CM | POA: Diagnosis not present

## 2024-08-06 DIAGNOSIS — E1165 Type 2 diabetes mellitus with hyperglycemia: Secondary | ICD-10-CM | POA: Diagnosis not present

## 2024-08-06 DIAGNOSIS — F1010 Alcohol abuse, uncomplicated: Secondary | ICD-10-CM | POA: Diagnosis not present

## 2024-08-20 DIAGNOSIS — I5032 Chronic diastolic (congestive) heart failure: Secondary | ICD-10-CM | POA: Diagnosis not present

## 2024-08-20 DIAGNOSIS — I110 Hypertensive heart disease with heart failure: Secondary | ICD-10-CM | POA: Diagnosis not present

## 2024-08-20 DIAGNOSIS — E785 Hyperlipidemia, unspecified: Secondary | ICD-10-CM | POA: Diagnosis not present

## 2024-08-20 DIAGNOSIS — I2510 Atherosclerotic heart disease of native coronary artery without angina pectoris: Secondary | ICD-10-CM | POA: Diagnosis not present
